# Patient Record
Sex: Female | Born: 1970 | Race: White | Hispanic: No | Marital: Married | Smoking: Former smoker
Health system: Southern US, Community
[De-identification: ages and names within clinical notes are randomized; demographics above are authoritative.]

## PROBLEM LIST (undated history)

## (undated) DIAGNOSIS — F17200 Nicotine dependence, unspecified, uncomplicated: Secondary | ICD-10-CM

## (undated) DIAGNOSIS — M797 Fibromyalgia: Secondary | ICD-10-CM

## (undated) DIAGNOSIS — M1990 Unspecified osteoarthritis, unspecified site: Secondary | ICD-10-CM

## (undated) DIAGNOSIS — M549 Dorsalgia, unspecified: Secondary | ICD-10-CM

## (undated) DIAGNOSIS — M51369 Other intervertebral disc degeneration, lumbar region without mention of lumbar back pain or lower extremity pain: Secondary | ICD-10-CM

## (undated) DIAGNOSIS — M5136 Other intervertebral disc degeneration, lumbar region: Secondary | ICD-10-CM

## (undated) DIAGNOSIS — G8929 Other chronic pain: Secondary | ICD-10-CM

## (undated) DIAGNOSIS — K432 Incisional hernia without obstruction or gangrene: Secondary | ICD-10-CM

## (undated) HISTORY — PX: APPENDECTOMY: SHX54

## (undated) HISTORY — PX: TUMOR REMOVAL: SHX12

## (undated) HISTORY — PX: HERNIA REPAIR: SHX51

## (undated) HISTORY — DX: Incisional hernia without obstruction or gangrene: K43.2

## (undated) HISTORY — PX: TUBAL LIGATION: SHX77

## (undated) HISTORY — DX: Nicotine dependence, unspecified, uncomplicated: F17.200

## (undated) HISTORY — DX: Other intervertebral disc degeneration, lumbar region: M51.36

## (undated) HISTORY — DX: Other intervertebral disc degeneration, lumbar region without mention of lumbar back pain or lower extremity pain: M51.369

---

## 1999-01-21 ENCOUNTER — Emergency Department (HOSPITAL_COMMUNITY): Admission: EM | Admit: 1999-01-21 | Discharge: 1999-01-21 | Payer: Self-pay | Admitting: Emergency Medicine

## 1999-01-24 ENCOUNTER — Emergency Department (HOSPITAL_COMMUNITY): Admission: EM | Admit: 1999-01-24 | Discharge: 1999-01-24 | Payer: Self-pay | Admitting: Internal Medicine

## 1999-03-23 ENCOUNTER — Encounter: Payer: Self-pay | Admitting: Family Medicine

## 1999-03-23 ENCOUNTER — Ambulatory Visit (HOSPITAL_COMMUNITY): Admission: RE | Admit: 1999-03-23 | Discharge: 1999-03-23 | Payer: Self-pay | Admitting: Family Medicine

## 1999-10-18 HISTORY — PX: HIATAL HERNIA REPAIR: SHX195

## 1999-11-17 ENCOUNTER — Emergency Department (HOSPITAL_COMMUNITY): Admission: EM | Admit: 1999-11-17 | Discharge: 1999-11-17 | Payer: Self-pay | Admitting: Emergency Medicine

## 2000-05-19 ENCOUNTER — Encounter: Admission: RE | Admit: 2000-05-19 | Discharge: 2000-05-19 | Payer: Self-pay | Admitting: Family Medicine

## 2000-05-22 ENCOUNTER — Emergency Department (HOSPITAL_COMMUNITY): Admission: EM | Admit: 2000-05-22 | Discharge: 2000-05-22 | Payer: Self-pay

## 2000-05-22 ENCOUNTER — Encounter: Payer: Self-pay | Admitting: General Surgery

## 2000-05-29 ENCOUNTER — Encounter: Admission: RE | Admit: 2000-05-29 | Discharge: 2000-05-29 | Payer: Self-pay | Admitting: Family Medicine

## 2000-05-29 ENCOUNTER — Encounter: Payer: Self-pay | Admitting: Family Medicine

## 2000-06-26 ENCOUNTER — Encounter: Payer: Self-pay | Admitting: *Deleted

## 2000-06-26 ENCOUNTER — Ambulatory Visit (HOSPITAL_COMMUNITY): Admission: RE | Admit: 2000-06-26 | Discharge: 2000-06-26 | Payer: Self-pay | Admitting: *Deleted

## 2000-07-03 ENCOUNTER — Ambulatory Visit (HOSPITAL_COMMUNITY): Admission: RE | Admit: 2000-07-03 | Discharge: 2000-07-03 | Payer: Self-pay | Admitting: *Deleted

## 2000-09-20 ENCOUNTER — Encounter: Payer: Self-pay | Admitting: Surgery

## 2000-09-26 ENCOUNTER — Inpatient Hospital Stay (HOSPITAL_COMMUNITY): Admission: RE | Admit: 2000-09-26 | Discharge: 2000-09-27 | Payer: Self-pay | Admitting: Surgery

## 2001-07-05 ENCOUNTER — Encounter: Payer: Self-pay | Admitting: Emergency Medicine

## 2001-07-05 ENCOUNTER — Emergency Department (HOSPITAL_COMMUNITY): Admission: EM | Admit: 2001-07-05 | Discharge: 2001-07-05 | Payer: Self-pay | Admitting: Emergency Medicine

## 2004-04-12 ENCOUNTER — Other Ambulatory Visit: Admission: RE | Admit: 2004-04-12 | Discharge: 2004-04-12 | Payer: Self-pay | Admitting: Obstetrics and Gynecology

## 2004-06-29 ENCOUNTER — Inpatient Hospital Stay (HOSPITAL_COMMUNITY): Admission: AD | Admit: 2004-06-29 | Discharge: 2004-06-29 | Payer: Self-pay | Admitting: Obstetrics and Gynecology

## 2004-07-03 ENCOUNTER — Inpatient Hospital Stay (HOSPITAL_COMMUNITY): Admission: AD | Admit: 2004-07-03 | Discharge: 2004-07-05 | Payer: Self-pay | Admitting: Obstetrics and Gynecology

## 2005-02-15 ENCOUNTER — Emergency Department (HOSPITAL_COMMUNITY): Admission: EM | Admit: 2005-02-15 | Discharge: 2005-02-15 | Payer: Self-pay | Admitting: Emergency Medicine

## 2007-06-22 ENCOUNTER — Inpatient Hospital Stay (HOSPITAL_COMMUNITY): Admission: AD | Admit: 2007-06-22 | Discharge: 2007-06-22 | Payer: Self-pay | Admitting: Obstetrics & Gynecology

## 2007-07-03 ENCOUNTER — Inpatient Hospital Stay (HOSPITAL_COMMUNITY): Admission: AD | Admit: 2007-07-03 | Discharge: 2007-07-03 | Payer: Self-pay | Admitting: Obstetrics and Gynecology

## 2008-07-19 ENCOUNTER — Inpatient Hospital Stay (HOSPITAL_COMMUNITY): Admission: AD | Admit: 2008-07-19 | Discharge: 2008-07-19 | Payer: Self-pay | Admitting: Obstetrics and Gynecology

## 2008-08-01 ENCOUNTER — Inpatient Hospital Stay (HOSPITAL_COMMUNITY): Admission: AD | Admit: 2008-08-01 | Discharge: 2008-08-02 | Payer: Self-pay | Admitting: Obstetrics and Gynecology

## 2008-08-04 ENCOUNTER — Inpatient Hospital Stay (HOSPITAL_COMMUNITY): Admission: AD | Admit: 2008-08-04 | Discharge: 2008-08-07 | Payer: Self-pay | Admitting: Obstetrics and Gynecology

## 2008-08-06 ENCOUNTER — Encounter (INDEPENDENT_AMBULATORY_CARE_PROVIDER_SITE_OTHER): Payer: Self-pay | Admitting: Obstetrics and Gynecology

## 2011-03-01 NOTE — Op Note (Signed)
Kelsey West, [employer]             ACCOUNT NO.:  [id number]   MEDICAL RECORD NO.:  [id number]          PATIENT TYPE:  INP   LOCATION:  9144                          FACILITY:  [facility]   PHYSICIAN:  Sherron Monday, MD        DATE OF BIRTH:  04-Dec-1970   DATE OF PROCEDURE:  08/06/2008  DATE OF DISCHARGE:                               OPERATIVE REPORT   PREOPERATIVE DIAGNOSIS:  Undesired fertility, status post spontaneous  vaginal delivery.   POSTOPERATIVE DIAGNOSIS:  Undesired fertility, status post spontaneous  vaginal delivery.   PROCEDURE:  Postpartum bilateral tubal ligation by Pomeroy method.   ANESTHESIA:  General with 5 mL of 0.25% Marcaine for local anesthesia.   SURGEON:  Sherron Monday, MD   FINDINGS:  Normal postpartum uterus, normal bilateral fallopian tubes.   SPECIMEN:  Bilateral tubal segments to pathology.   COMPLICATIONS:  None.   ESTIMATED BLOOD LOSS:  Minimal.   INTRAVENOUS FLUIDS:  800cc.   URINE OUTPUT:  She voided directly before the procedure.   COMPLICATIONS:  None.   PROCEDURE:  After informed consent was reviewed with the patient  including risks, benefits, and alternatives of a tubal ligation as well  as reversible forms of birth control and increased risk of ectopic with  failure of tubal ligation and future pregnancy, the patient voices  understanding of above procedure.  She was transported back to the  operating room, placed on the table in supine position,  General  anesthesia was induced and found to be adequate.  She was then prepped  and draped in the normal sterile fashion.  Approximately 2-cm  infraumbilical incision was made, carried through the underlying layer  of fascia sharply.  The fascia was grasped with Kocher clamps, elevated,  and incised with Mayo scissors.  This was extended superiorly and  inferiorly.  Peritoneum was entered bluntly.  Confirmation of entry from  digital exam.  The patient was placed in leftward tilt.  A moist  tape  laparotomy sponge was placed and the tube was easily identified,  followed out to the fimbriated end, elevated with Babcock, and the tube  was doubly ligated with plain gut suture.  The intervening portion was  removed in a Pomeroy fashion and sent to pathology.  The patient was  then placed in the rightward tilt.  The moist tape laparotomy sponge was  placed to remove the bowel from the field of vision.  The tube was  easily identified, carried out to the fimbriated end, and doubly ligated  in a Pomeroy fashion.  The intervening portion was removed and sent to  pathology.  Hemostasis was  assured.  The fascia was reapproximated using 0 Vicryl.  Skin was  reapproximated using 3-0 Vicryl in subcuticular fashion.  Benzoin and  Steris were applied.  The patient tolerated the procedure well.  Sponge,  lap, and needle counts were correct x2 at the end of the procedure per  the operating room staff.      Sherron Monday, MD  Electronically Signed     JB/MEDQ  D:  08/06/2008  T:  08/07/2008  Job:  [id number]

## 2011-03-01 NOTE — Discharge Summary (Signed)
Kelsey West, CHASTANG             ACCOUNT NO.:  [id number]   MEDICAL RECORD NO.:  [id number]          PATIENT TYPE:  INP   LOCATION:  9144                          [facility]ILITY[facility]  [facility]   PHYSICIAN:  Sherron Monday, MD        DATE OF BIRTH:  12-02-1970   DATE OF ADMISSION:  08/05/2008  DATE OF DISCHARGE:  08/07/2008                               DISCHARGE SUMMARY   ADMITTING DIAGNOSIS:  Intrauterine pregnancy at term in early labor,  undesired fertility.   DISCHARGE DIAGNOSIS:  Intrauterine pregnancy at term in early labor,  delivered,  status post postpartum bilateral tubal ligation.   PROCEDURE:  Spontaneous vaginal delivery, postpartum bilateral tubal  ligation.   HISTORY OF PRESENT ILLNESS:  This is a 40 year old G7, P2-0-4-2 at 37  plus weeks with contractions increasing in intensity and frequency.  She  states she has good fetal movement.  No loss of fluid.  No vaginal  bleeding.  Her pregnancy is complicated by advanced maternal age,  otherwise uncomplicated.  Please refer to her prenatal records for  complete information.  She was evaluated at the Maternal Admissions Unit  and found to have cervical change.  She was admitted for labor.   PAST MEDICAL HISTORY:  Significant for kidney stones.   PAST SURGICAL HISTORY:  Significant for D&C, finger surgery,  appendectomy, and hiatal hernia repair.   PAST OB/GYN HISTORY:  G1 and 2 were miscarriages, G3 was the term  vaginal delivery, G4 and G5 were therapeutic abortions.  G6 was a term  vaginal delivery.  G7 is present pregnancy.  No history of any abnormal  Pap smears or sexually transmitted disease.   MEDICATIONS:  Include prenatal vitamins.   ALLERGIES:  No known drug allergies.   SOCIAL HISTORY:  She smokes half a pack day.  No alcohol or drug use and  is married.   FAMILY HISTORY:  Significant for coronary artery disease in mother,  paternal grandfather with the stroke, brother with OCD, COPD in maternal  grandmother and  thyroid disease in mother.   PRENATAL LABS:  Hemoglobin 12.4, platelets 317, O positive, antibody  screen negative, gonorrhea negative, chlamydia negative, RPR  nonreactive, rubella immune.  Cystic fibrosis screen negative.  Hepatitis C surface antigen negative.  HIV negative.  First trimester  screen within normal limits.  AFB within normal limits.  Glucola of 140  with a 3-hour GTT of 75, 133, 99, and 90.  Group B strep was negative.   First trimester ultrasound revealed normal nuchal translucency and  second trimester U/S confirmed [facility] August 22, 2008, revealed normal  anatomy, anterior placenta, and a female infant.   On admission, she was afebrile.  Vital signs stable and a benign exam  with fetal heart rate tones in the 130s and reactive and contracting  every 2 to 4 minutes.  She was admitted and as she was group B strep  negative, did not need prophylaxis.  Plan was for artificial rupture of  membranes which was performed with clear fluid, and she progressed  rapidly to complete and plus 2, to deliver for approximately  5 minutes.  She delivered viable female infant at 2:54 a.m. with Apgars of 9 at 1  minute and 9 at 5 minute, infant weight is 6 pounds, 4 ounces.  She had  the  labial laceration repaired with 3-0 Vicryl in typical fashion and  placenta was expressed intact and EBL was less than 500 mL.  Her  postpartum course was relatively uncomplicated.  On postpartum day #1.  She underwent a bilateral tubal ligation without complications.  She was  discharged home on postop #2 having remained afebrile with stable vital  signs.  Hemoglobin decreased from 12.9 to 10.6.  She was discharged to  home with prescriptions for Motrin, Vicodin, and prenatal vitamins.  She  was given routine discharge instructions and numbers to call with any  questions or problems.  She voiced understanding of all of these and was  discharged home with followup in 2 weeks.      Sherron Monday, MD   Electronically Signed     JB/MEDQ  D:  08/07/2008  T:  08/07/2008  Job:  [id number]

## 2011-03-04 NOTE — Op Note (Signed)
[facility]  Patient:    Kelsey West, Kelsey West                    MRN: [id number] Proc. Date: 07/03/00 Adm. Date:  [id number] Disc. Date: [id number] Attending:  Sharyn Dross                           Operative Report  PREOPERATIVE DIAGNOSES: 1. Large hiatal hernia. 2. History of some dysphagia-like symptoms.  POSTOPERATIVE DIAGNOSES:  Slow peristalsis but grossly normal esophageal manometry study.  PROCEDURE:  Esophageal manometry study.  MEDICATIONS:  None.  ENDOSCOPIST:  Sharyn Dross., M.D.  PREOPERATIVE PREPARATION:  The patient was brought to the endoscopy unit where she was placed in a chair at this time.  The esophageal manometry probe, which is a probe which has three measuring devices on it, was then passed through the nasal cavity into the esophageal area.  The purpose of this was to measure the pressure to determine if there was evidence of poor esophageal peristaltic activity was noted.  The has the largest of hiatal hernias.  Based on the x-ray findings at that point, she was evaluated for possibility of laparoscopic fundoplication and, in the process, esophageal manometry studies were performed.  DESCRIPTION OF PROCEDURE:  With the instrument advanced into the nasal cavity at this time, the instrument was advanced all the way into the esophageal region.  The instrument was gradually retracted back where pressures were measured at the lowest esophageal sphincter as well as the esophageal body that was noted.  The patient tolerated the procedure well after it was completed and instrument was subsequently removed.  RESULTS:  The patient was noted to have low esophageal pressures that were noted with good relaxation that was appreciated.  The relaxation pressure went from approximately from 15 to 20 mm and relaxed all the way down to just above 0 that was present.  On two waveform propagations that was noted, this was repeated,  and the results were noted as above.  Relaxation time appeared to be unremarkable, and the contractions occurred back to approximately  25 mmHg that was noted.  The procedure was repeated again for duplication, and the duplication was noted to be performed.  The esophageal measurements and testing were consistent with good peristaltic activity that was noted.  There was wave propagation throughout the area, and there was not evidence of any spontaneous propagation that was noted.  The waveforms varied between the leads, but the range ranged between 20 and 125 mmHg pressure that was present.  The propagation appeared to be normal throughout the area.  The upper esophageal sphincter and the pharyngeal areas shows evidence of excellent wave propagation that was noted with good peristaltic activity appreciated.  This result also appeared to be normal.  RECOMMENDATIONS:  The patient has grossly normal peristaltic activity at this time.  Due to the fact that she has such a large hiatal hernia that is present, we will presently refer her for surgical evaluation for possible laparoscopic fundoplication or direct surgery to correct this problem. DD:  07/19/00 TD:  07/20/00 Job: [id number]

## 2011-03-04 NOTE — Op Note (Signed)
[facility]  Patient:    TEGAN, BRITAIN             MRN: [id number] Proc. Date: 09/26/00 Adm. Date:  [id number] Attending:  Katha Cabal CC:         Sharyn Dross., M.D.   Operative Report  PREOPERATIVE DIAGNOSIS:  Sliding hiatal hernia with gastroesophageal reflux disease.  POSTOPERATIVE DIAGNOSIS:  Sliding hiatal hernia with gastroesophageal reflux disease.  PROCEDURE:  Laparoscopic Nissen fundoplication (3) with closure of the crura, four, one anterior and three posterior with two figure-of-eights.  SURGEON:  Thornton Park. Daphine Deutscher, M.D.  ASSISTANT:  Sharlet Salina T. Hoxworth, M.D.  ANESTHESIA:  General endotracheal.  DESCRIPTION OF PROCEDURE:  Ms. Glendora Clouatre is a 40 year old lady with about a seven year history of gastroesophageal reflux disease.  The hiatal hernia, which was fairly large, was easily exposed, and this was done after I placed the purse-string above the umbilicus and inserted a camera through Hasson technique and then four upper abdominal trocar placements.  The patient was in a supine position.  The lesser curvature was grasped and using the harmonic scalpel, the hepatogastric ligament was taken down, and I incised up over the right crura to completely dissect the right crura.  I then carried this anteriorly and went across and dissected the left crura.  I went posteriorly and identified the vagal nerve which I kept up next to the esophagus in the subsequent wrap.  Next, we went over and entered the lesser sac and took down all the short gastrics using harmonic scalpel and carried this up, again to the left crura and dissected that.  With a Penrose drain around the EG junction, I then exposed the hiatus and closed this with two figure-of-eight sutures and a simple suture posteriorly. Anteriorly, a single simple suture was used to approximate that.  This snugged up the esophagus nicely.  A 56 dilator was  then passed easily, and the cardia was brought around behind the esophagus.  A contiguous portion of the stomach along the greater curvature side was taken and was sutured to the wrapped portion of the stomach using the endostitch.  Three sutures were placed, each with a purchase of esophagus, and the top was tied extracorporealy with two intracorporeal ties.  Then we tacked the wrapped portion of the stomach to the right crus and the left portion of the stomach to the left crus.  These were tied down as well.  There was essentially no bleeding noted.  The wrap appeared to be in good position and with the Bougie removed, it looked adequately snug and nicely closed.  The patient seemed to tolerate the procedure well.  The port sites were injected with Marcaine.  The umbilical port was tied down, and a single simple extra suture of 0 Vicryl was used to close the fascia at the supraumbilical port.  All of the other trocars were withdrawn, and the abdomen was deflated.  The wounds were then closed with 4-0 Vicryl with Benzoin and Steri-Strips.  The patient was taken to the recovery room in satisfactory condition. DD:  09/26/00 TD:  09/26/00 Job: [id number]

## 2011-03-04 NOTE — Discharge Summary (Signed)
Kelsey West, Kelsey West             ACCOUNT NO.:  [id number]   MEDICAL RECORD NO.:  [id number]          PATIENT TYPE:  INP   LOCATION:  9101                          [facility]ILITY[facility]  [facility]   PHYSICIAN:  Zenaida Niece, M.D.DATE OF BIRTH:  1970-10-18   DATE OF ADMISSION:  07/03/2004  DATE OF DISCHARGE:  07/05/2004                                 DISCHARGE SUMMARY   ADMISSION DIAGNOSIS:  Intrauterine pregnancy at 37 weeks.   DISCHARGE DIAGNOSIS:  Intrauterine pregnancy at 37 weeks.   PROCEDURE PERFORMED:  On July 03, 2004 she had a spontaneous vaginal  delivery.   HISTORY AND PHYSICAL:  This is a 40 year old white female, gravida 6, para 1-  0-4-1 with an EGA of 37+ weeks who presents with the complaint of regular  contractions and bloody show with good fetal movement and no rupture of  membranes.  Evaluation at triage revealed her to have regular contractions  and the cervix changed from 3 to 4 cm dilated.   PRENATAL CARE:  Complicated by late prenatal care initiated at 26 weeks,  gastroesophageal reflux disease treated with over-the-counter Zantac.   PRENATAL LABORATORY DATA:  Blood type is O positive with a  negative  antibody screen.  RPR nonreactive.  Rubella immune.  Hepatitis B surface  antigen negative.  HIV negative.  Gonorrhea and Chlamydia negative.  One  hour Glucola was 161.  Three hours GTT was 85, 165, 136, and 125.  Group B  Streptococcus is negative.   PAST OBSTETRICAL HISTORY:  In 1994, a vaginal delivery at 38 weeks, 8 pounds  3 ounces, no complications.  She has had two spontaneous abortions and two  elective terminations.   PAST MEDICAL HISTORY:  Hiatal hernia.   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   MEDICATIONS PRIOR TO ADMISSION:  Over-the-counter Zantac.   SOCIAL HISTORY:  She does smoke a half-pack to a pack of cigarettes a day.   PHYSICAL EXAMINATION:  VITAL SIGNS:  She was afebrile with  stable vital  signs.  Fetal heart tracing reactive with contractions every 2-4 minutes.  ABDOMEN:  Gravid, nontender with an estimated fetal weight of 7-1/2 pounds.  The cervix was 6, complete, 0, with a vertex presentation, and amniotomy  reveals clear fluid.   HOSPITAL COURSE:  The patient was admitted and progressed from 4 to 6 cm.  Amniotomy was performed for augmentation.  She progressed to complete,  pushed well, and on the evening of July 03, 2004 had a vaginal delivery  of a viable female infant with Apgar's of 9 and 9 who weighed 6 pounds 12  ounces.  Placenta delivered spontaneously and was intact.  The perineum had  2-3 small abrasions which were hemostatic and not repaired.  Estimated blood  loss was less than 500 cubic centimeters.  Postpartum, the patient had no  significant complications.  On the evening of delivery, she did fall in the  bathroom and hit her head but had no loss of consciousness and no further  complications.  Predelivery CBC revealed a white count of 19.2,  hemoglobin  11.7, platelet count of 405,000.  Postpartum CBC revealed a white count of  21.8, hemoglobin 8.4, platelet count of 366,000 and that is on postpartum  day #1.  CBC on postpartum day #2 revealed a white count decreasing to 17.9,  hemoglobin 7.5, platelet count of 384,000.  She had no obvious evidence of  infection at that point, and was felt to be stable enough for discharge  home.   DISCHARGE INSTRUCTIONS:  Regular.   DISCHARGE ACTIVITIES:  Pelvic rest.   FOLLOW UP:  In six weeks.   DISCHARGE MEDICATIONS:  Percocet #20, 1-2 p.o. q.4-6h. p.r.n. pain and over-  the-counter ibuprofen as needed.  She is also to take over-the-counter iron  b.i.d.   She is given our discharge pamphlet.     Todd   TDM/MEDQ  D:  07/18/2004  T:  07/19/2004  Job:  [id number]

## 2011-07-18 LAB — CBC
MCHC: 33.5
MCV: 95.2
Platelets: 290
Platelets: 316
RDW: 13

## 2011-07-18 LAB — RPR: RPR Ser Ql: NONREACTIVE

## 2011-07-29 LAB — URINALYSIS, ROUTINE W REFLEX MICROSCOPIC
Ketones, ur: NEGATIVE
Protein, ur: NEGATIVE
Urobilinogen, UA: 0.2

## 2011-07-29 LAB — CBC
MCV: 91
RBC: 4.76
WBC: 17.4 — ABNORMAL HIGH

## 2011-07-29 LAB — GC/CHLAMYDIA PROBE AMP, GENITAL: GC Probe Amp, Genital: NEGATIVE

## 2011-07-29 LAB — WET PREP, GENITAL
Trich, Wet Prep: NONE SEEN
Yeast Wet Prep HPF POC: NONE SEEN

## 2011-07-29 LAB — URINE MICROSCOPIC-ADD ON

## 2011-07-29 LAB — POCT PREGNANCY, URINE: Operator id: 220991

## 2011-07-29 LAB — HCG, QUANTITATIVE, PREGNANCY: hCG, Beta Chain, Quant, S: 5736 — ABNORMAL HIGH

## 2011-07-29 LAB — ABO/RH: ABO/RH(D): O POS

## 2012-02-29 ENCOUNTER — Ambulatory Visit (HOSPITAL_COMMUNITY)
Admission: RE | Admit: 2012-02-29 | Discharge: 2012-02-29 | Disposition: A | Discharge status: Home / Self Care | Payer: Self-pay | Source: Ambulatory Visit | Attending: Internal Medicine | Admitting: Internal Medicine

## 2012-02-29 ENCOUNTER — Other Ambulatory Visit (HOSPITAL_COMMUNITY): Payer: Self-pay | Admitting: Internal Medicine

## 2012-02-29 DIAGNOSIS — M5380 Other specified dorsopathies, site unspecified: Secondary | ICD-10-CM | POA: Insufficient documentation

## 2012-02-29 DIAGNOSIS — M545 Low back pain, unspecified: Secondary | ICD-10-CM | POA: Insufficient documentation

## 2012-02-29 DIAGNOSIS — M542 Cervicalgia: Secondary | ICD-10-CM | POA: Insufficient documentation

## 2012-02-29 DIAGNOSIS — Q765 Cervical rib: Secondary | ICD-10-CM | POA: Insufficient documentation

## 2012-02-29 DIAGNOSIS — M79609 Pain in unspecified limb: Secondary | ICD-10-CM | POA: Insufficient documentation

## 2013-03-05 ENCOUNTER — Emergency Department (HOSPITAL_COMMUNITY)
Admission: EM | Admit: 2013-03-05 | Discharge: 2013-03-05 | Disposition: A | Discharge status: Home / Self Care | Payer: Self-pay | Attending: Emergency Medicine | Admitting: Emergency Medicine

## 2013-03-05 ENCOUNTER — Encounter (HOSPITAL_COMMUNITY): Payer: Self-pay | Admitting: *Deleted

## 2013-03-05 DIAGNOSIS — M7989 Other specified soft tissue disorders: Secondary | ICD-10-CM | POA: Insufficient documentation

## 2013-03-05 DIAGNOSIS — G8929 Other chronic pain: Secondary | ICD-10-CM | POA: Insufficient documentation

## 2013-03-05 DIAGNOSIS — M549 Dorsalgia, unspecified: Secondary | ICD-10-CM | POA: Insufficient documentation

## 2013-03-05 DIAGNOSIS — Z760 Encounter for issue of repeat prescription: Secondary | ICD-10-CM | POA: Insufficient documentation

## 2013-03-05 DIAGNOSIS — R209 Unspecified disturbances of skin sensation: Secondary | ICD-10-CM | POA: Insufficient documentation

## 2013-03-05 DIAGNOSIS — Z79899 Other long term (current) drug therapy: Secondary | ICD-10-CM | POA: Insufficient documentation

## 2013-03-05 DIAGNOSIS — Z8739 Personal history of other diseases of the musculoskeletal system and connective tissue: Secondary | ICD-10-CM | POA: Insufficient documentation

## 2013-03-05 HISTORY — DX: Dorsalgia, unspecified: M54.9

## 2013-03-05 HISTORY — DX: Other chronic pain: G89.29

## 2013-03-05 MED ORDER — HYDROCODONE-ACETAMINOPHEN 5-325 MG PO TABS: 1.0000 | ORAL_TABLET | Freq: Four times a day (QID) | ORAL | Status: DC | PRN

## 2013-03-05 MED ORDER — NAPROXEN 500 MG PO TABS: 500.0000 mg | ORAL_TABLET | Freq: Two times a day (BID) | ORAL | Status: DC | PRN

## 2013-03-05 MED ORDER — METHOCARBAMOL 750 MG PO TABS: 750.0000 mg | ORAL_TABLET | Freq: Four times a day (QID) | ORAL | Status: DC | PRN

## 2013-03-05 NOTE — ED Notes (Signed)
Pt in c/o chronic back and neck pain, states pain has been going on since last year, her doctor recently left the practice and she is out of medication she normally takes for pain, states it radiates into her arms and her legs.

## 2013-03-05 NOTE — ED Provider Notes (Signed)
History  This chart was scribed for non-physician practitioner Dierdre Forth, PA-C working with Dione Booze, MD, by Candelaria Stagers, ED Scribe. This patient was seen in room TR09C/TR09C and the patient's care was started at 4:02 PM   CSN: [id number]  Arrival date & time 03/05/13  1538   First MD Initiated Contact with Patient 03/05/13 1552      Chief Complaint  Patient presents with  . Back Pain   The history is provided by the patient. No language interpreter was used.   HPI Comments: Kelsey West is a 42 y.o. female who presents to the Emergency Department complaining of gradually worsening chronic back pain which radiates to her lower back, upper back, neck, and bilateral shoulders that started about one year ago.  Pt reports she has been diagnosed with degenerative disc disease.  She has been seen over the last year at [facility] who has told her they can no longer manage her pain at this clinic.  Pt typically takes Lortab which she reports is no longer helping with the pain.  She reports new sx of mottling to the skin of legs and arms that started several weeks ago occurring more when she is cold with no associated symptoms.  Pt also reports swelling of knuckles, more to right hand than left, which is worse after excessive usage of hands.  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  She also reports some numbness to the right foot while driving that she describes as the foot feeling asleep, but does not experience this at other times.  Pt denies loss of bowel or bladder control, saddle anesthesia, foot drop, loss of leg function.  Pt has taken ibuprofen and applied ice and heat with little relief.    Past Medical History  Diagnosis Date  . Chronic back pain     History reviewed. No pertinent past surgical history.  History reviewed. No pertinent family history.  History  Substance Use Topics  . Smoking status: Not on file  . Smokeless tobacco:  Not on file  . Alcohol Use: Not on file    OB History   Grav Para Term Preterm Abortions TAB SAB Ect Mult Living                  Review of Systems  Musculoskeletal: Positive for back pain and joint swelling (intermittent swelling of bilateral knuckles).  Skin: Positive for color change (intermittent mottling of skin).  Neurological: Positive for numbness (occasional swelling of right foot).  All other systems reviewed and are negative.    Allergies  Review of patient's allergies indicates no known allergies.  Home Medications   Current Outpatient Rx  Name  Route  Sig  Dispense  Refill  . Aspirin-Salicylamide-Caffeine (BC HEADACHE POWDER PO)   Oral   Take 1 packet by mouth daily as needed (for pain).         . cyclobenzaprine (FLEXERIL) 10 MG tablet   Oral   Take 10 mg by mouth at bedtime as needed for muscle spasms.         Marland Kitchen HYDROcodone-acetaminophen (NORCO) 10-325 MG per tablet   Oral   Take 1 tablet by mouth every 6 (six) hours as needed for pain.         Marland Kitchen ibuprofen (ADVIL,MOTRIN) 200 MG tablet   Oral   Take 800 mg by mouth every 6 (six) hours as needed for pain.         Marland Kitchen  HYDROcodone-acetaminophen (NORCO/VICODIN) 5-325 MG per tablet   Oral   Take 1 tablet by mouth every 6 (six) hours as needed for pain (Take 1 - 2 tablets every 4 - 6 hours.).   20 tablet   0   . methocarbamol (ROBAXIN) 750 MG tablet   Oral   Take 1 tablet (750 mg total) by mouth 4 (four) times daily as needed (Take 1 tablet every 6 hours as needed for muscle spasms.).   20 tablet   0   . naproxen (NAPROSYN) 500 MG tablet   Oral   Take 1 tablet (500 mg total) by mouth 2 (two) times daily as needed.   30 tablet   0     BP 155/94  Pulse 105  Temp(Src) 98.3 F (36.8 C) (Oral)  Resp 20  Ht 5\' 6"  (1.676 m)  Wt 150 lb (68.04 kg)  BMI 24.22 kg/m2  SpO2 99%  Physical Exam  Nursing note and vitals reviewed. Constitutional: She is oriented to person, place, and time. She  appears well-developed and well-nourished. No distress.  HENT:  Head: Normocephalic and atraumatic.  Mouth/Throat: Oropharynx is clear and moist. No oropharyngeal exudate.  Eyes: Conjunctivae are normal.  Neck: Normal range of motion. Neck supple.  Full ROM without pain  Cardiovascular: Normal rate, regular rhythm and intact distal pulses.   Pulmonary/Chest: Effort normal and breath sounds normal. No respiratory distress. She has no wheezes.  Abdominal: Soft. She exhibits no distension. There is no tenderness.  Musculoskeletal:  Para spinal tenderness of low C-spine to L-Spine.    Lymphadenopathy:    She has no cervical adenopathy.  Neurological: She is alert and oriented to person, place, and time. She has normal reflexes.  Speech is clear and goal oriented, follows commands Normal strength in upper and lower extremities bilaterally including dorsiflexion and plantar flexion, strong and equal grip strength Sensation normal to light and sharp touch Moves extremities without ataxia, coordination intact Normal gait Normal balance   Skin: Skin is warm and dry. No rash noted. She is not diaphoretic. No erythema.  Psychiatric: She has a normal mood and affect. Her behavior is normal.    ED Course  Procedures  DIAGNOSTIC STUDIES: Oxygen Saturation is 99% on room air, normal by my interpretation.    COORDINATION OF CARE:  4:10 PM Discussed course of care with pt which includes antiinflammatory and pain medication.  Return precautions discussed.  Will provide resources for follow up.  Pt understands and agrees.   Labs Reviewed - No data to display No results found.   1. Chronic back pain greater than 3 months duration   2. Medication refill       MDM  Linna Hoff presents with chronic back pain and without further follow-up for pain management.  Pt took her last medication this morning.  Patient with back pain on exam.  No neurological deficits and normal neuro exam.   Patient can walk but states is painful.  No loss of bowel or bladder control.  No concern for cauda equina.  No fever, night sweats, weight loss, h/o cancer, IVDU.  RICE protocol and pain medicine indicated and discussed with patient. Will refill medication for several days and give referral to pain management and resource guide to find PCP.  I have also discussed reasons to return immediately to the ER.  Patient expresses understanding and agrees with plan.  I personally performed the services described in this documentation, which was scribed in my presence. The  recorded information has been reviewed and is accurate.         Dahlia Client Ziggy Chanthavong, PA-C 03/05/13 1628

## 2013-03-06 NOTE — ED Provider Notes (Signed)
Medical screening examination/treatment/procedure(s) were performed by non-physician practitioner and as supervising physician I was immediately available for consultation/collaboration.   Dione Booze, MD 03/06/13 Moses Manners

## 2013-03-21 ENCOUNTER — Emergency Department (HOSPITAL_COMMUNITY)
Admission: EM | Admit: 2013-03-21 | Discharge: 2013-03-21 | Disposition: A | Discharge status: Home / Self Care | Payer: Self-pay | Attending: Emergency Medicine | Admitting: Emergency Medicine

## 2013-03-21 ENCOUNTER — Encounter (HOSPITAL_COMMUNITY): Payer: Self-pay | Admitting: Emergency Medicine

## 2013-03-21 DIAGNOSIS — Z8739 Personal history of other diseases of the musculoskeletal system and connective tissue: Secondary | ICD-10-CM | POA: Insufficient documentation

## 2013-03-21 DIAGNOSIS — M542 Cervicalgia: Secondary | ICD-10-CM | POA: Insufficient documentation

## 2013-03-21 DIAGNOSIS — G8929 Other chronic pain: Secondary | ICD-10-CM | POA: Insufficient documentation

## 2013-03-21 DIAGNOSIS — M549 Dorsalgia, unspecified: Secondary | ICD-10-CM | POA: Insufficient documentation

## 2013-03-21 DIAGNOSIS — R339 Retention of urine, unspecified: Secondary | ICD-10-CM | POA: Insufficient documentation

## 2013-03-21 MED ORDER — IBUPROFEN 800 MG PO TABS
800.0000 mg | ORAL_TABLET | Freq: Once | ORAL | Status: AC
  Administered 2013-03-21: 800 mg via ORAL
  Filled 2013-03-21: qty 1

## 2013-03-21 NOTE — ED Notes (Signed)
Pain sharp in neck and back x 3-4 days rads to hips

## 2013-03-21 NOTE — ED Provider Notes (Signed)
History     CSN: [id number]  Arrival date & time 03/21/13  1120   First MD Initiated Contact with Patient 03/21/13 1125      Chief Complaint  Patient presents with  . Back Pain    (Consider location/radiation/quality/duration/timing/severity/associated sxs/prior treatment) HPI  42 year old female presents complaining of back pain. Patient reports she has a history of degenerative disc disease which has been giving her neck and back pain ongoing for over a year. Pain is described as a throbbing aching sensation worsening with prolonged standing and with movement and sometimes improves with rest. She also report occasional tingling sensation to the ball of her right feet. She reports pain radiates up and down the back and has been worsened for the past for 5 days. Pain is not relieved with taking ibuprofen at home. She reports she has had treatment from her primary care Dr. with Vicodin and muscle relaxer for over a year however he is no longer working and she cannot get the medication anymore. Patient denies fever, chills, rash, urinary or bowel incontinence, or saddle paresthesia. No recent trauma. Denies any dysuria or hematuria. Normal bowel movement.  Past Medical History  Diagnosis Date  . Chronic back pain     No past surgical history on file.  No family history on file.  History  Substance Use Topics  . Smoking status: Not on file  . Smokeless tobacco: Not on file  . Alcohol Use: Not on file    OB History   Grav Para Term Preterm Abortions TAB SAB Ect Mult Living                  Review of Systems  Constitutional: Negative for fever.  HENT: Positive for neck pain.   Musculoskeletal: Positive for back pain.  Skin: Negative for rash.  Neurological: Negative for numbness and headaches.    Allergies  Review of patient's allergies indicates no known allergies.  Home Medications   Current Outpatient Rx  Name  Route  Sig  Dispense  Refill  .  Aspirin-Salicylamide-Caffeine (BC HEADACHE POWDER PO)   Oral   Take 1 packet by mouth daily as needed (for pain).         . cyclobenzaprine (FLEXERIL) 10 MG tablet   Oral   Take 10 mg by mouth at bedtime as needed for muscle spasms.         Marland Kitchen HYDROcodone-acetaminophen (NORCO) 10-325 MG per tablet   Oral   Take 1 tablet by mouth every 6 (six) hours as needed for pain.         Marland Kitchen HYDROcodone-acetaminophen (NORCO/VICODIN) 5-325 MG per tablet   Oral   Take 1 tablet by mouth every 6 (six) hours as needed for pain (Take 1 - 2 tablets every 4 - 6 hours.).   20 tablet   0   . ibuprofen (ADVIL,MOTRIN) 200 MG tablet   Oral   Take 800 mg by mouth every 6 (six) hours as needed for pain.         . methocarbamol (ROBAXIN) 750 MG tablet   Oral   Take 1 tablet (750 mg total) by mouth 4 (four) times daily as needed (Take 1 tablet every 6 hours as needed for muscle spasms.).   20 tablet   0   . naproxen (NAPROSYN) 500 MG tablet   Oral   Take 1 tablet (500 mg total) by mouth 2 (two) times daily as needed.   30 tablet   0  There were no vitals taken for this visit.  Physical Exam  Nursing note and vitals reviewed. Constitutional: She is oriented to person, place, and time. She appears well-developed and well-nourished. No distress.  HENT:  Head: Atraumatic.  Eyes: Conjunctivae are normal.  Neck:  No nuchal rigidity  Abdominal: There is no tenderness.  Genitourinary:  No CVA tenderness  Musculoskeletal: She exhibits tenderness (Tenderness to paracervical and paralumbar region without significant midline spine tenderness, crepitus, step-off noted. No overlying skin changes.). She exhibits no edema.  Neurological: She is alert and oriented to person, place, and time.  Patellar deep tendon reflex 2+ bilaterally. No foot drop. Walk with a mildly antalgic gait.  Skin: No rash noted.  Psychiatric: She has a normal mood and affect.    ED Course  Procedures (including critical  care time)  11:45 AM Acute on chronic back pain, no red flags, no recent trauma. I discussed with patient that narcotic pain medication is not appropriate to treat degenerative disc disease. I reviewed her prior x-ray from 2013 and did not see any significant degenerative changes. Advance imaging not indicated today.  I recommend for patient to followup with orthopedic Dr. for further management of her condition. Rice therapy discussed. Ibuprofen given in the ED. Patient voiced understanding and agrees with plan.  Labs Reviewed - No data to display No results found.   1. Chronic back pain       MDM  BP 155/90  Pulse 91  Temp(Src) 98.3 F (36.8 C)  Resp 16  SpO2 98%         Fayrene Helper, PA-C 03/21/13 1148

## 2013-03-21 NOTE — ED Notes (Signed)
Back pain for months and has seen a dr but her feet feel occ like they are going numb . Has bee on lortab is out no new injury

## 2013-03-22 NOTE — ED Provider Notes (Signed)
Medical screening examination/treatment/procedure(s) were performed by non-physician practitioner and as supervising physician I was immediately available for consultation/collaboration.   Gwyneth Sprout, MD 03/22/13 [phone]

## 2013-05-16 ENCOUNTER — Ambulatory Visit: Payer: No Typology Code available for payment source | Attending: Family Medicine | Admitting: Family Medicine

## 2013-05-16 ENCOUNTER — Encounter: Payer: Self-pay | Admitting: Family Medicine

## 2013-05-16 VITALS — BP 138/85 | HR 78 | Temp 98.0°F | Ht 66.0 in | Wt 160.6 lb

## 2013-05-16 DIAGNOSIS — M5430 Sciatica, unspecified side: Secondary | ICD-10-CM

## 2013-05-16 DIAGNOSIS — M5431 Sciatica, right side: Secondary | ICD-10-CM

## 2013-05-16 DIAGNOSIS — G8929 Other chronic pain: Secondary | ICD-10-CM

## 2013-05-16 DIAGNOSIS — M549 Dorsalgia, unspecified: Secondary | ICD-10-CM

## 2013-05-16 NOTE — Patient Instructions (Addendum)
Back Pain, Adult Low back pain is very common. About 1 in 5 people have back pain.The cause of low back pain is rarely dangerous. The pain often gets better over time.About half of people with a sudden onset of back pain feel better in just 2 weeks. About 8 in 10 people feel better by 6 weeks.  CAUSES Some common causes of back pain include:  Strain of the muscles or ligaments supporting the spine.  Wear and tear (degeneration) of the spinal discs.  Arthritis.  Direct injury to the back. DIAGNOSIS Most of the time, the direct cause of low back pain is not known.However, back pain can be treated effectively even when the exact cause of the pain is unknown.Answering your caregiver's questions about your overall health and symptoms is one of the most accurate ways to make sure the cause of your pain is not dangerous. If your caregiver needs more information, he or she may order lab work or imaging tests (X-rays or MRIs).However, even if imaging tests show changes in your back, this usually does not require surgery. HOME CARE INSTRUCTIONS For many people, back pain returns.Since low back pain is rarely dangerous, it is often a condition that people can learn to manageon their own.   Remain active. It is stressful on the back to sit or stand in one place. Do not sit, drive, or stand in one place for more than 30 minutes at a time. Take short walks on level surfaces as soon as pain allows.Try to increase the length of time you walk each day.  Do not stay in bed.Resting more than 1 or 2 days can delay your recovery.  Do not avoid exercise or work.Your body is made to move.It is not dangerous to be active, even though your back may hurt.Your back will likely heal faster if you return to being active before your pain is gone.  Pay attention to your body when you bend and lift. Many people have less discomfortwhen lifting if they bend their knees, keep the load close to their bodies,and  avoid twisting. Often, the most comfortable positions are those that put less stress on your recovering back.  Find a comfortable position to sleep. Use a firm mattress and lie on your side with your knees slightly bent. If you lie on your back, put a pillow under your knees.  Only take over-the-counter or prescription medicines as directed by your caregiver. Over-the-counter medicines to reduce pain and inflammation are often the most helpful.Your caregiver may prescribe muscle relaxant drugs.These medicines help dull your pain so you can more quickly return to your normal activities and healthy exercise.  Put ice on the injured area.  Put ice in a plastic bag.  Place a towel between your skin and the bag.  Leave the ice on for 15-20 minutes, 3-4 times a day for the first 2 to 3 days. After that, ice and heat may be alternated to reduce pain and spasms.  Ask your caregiver about trying back exercises and gentle massage. This may be of some benefit.  Avoid feeling anxious or stressed.Stress increases muscle tension and can worsen back pain.It is important to recognize when you are anxious or stressed and learn ways to manage it.Exercise is a great option. SEEK MEDICAL CARE IF:  You have pain that is not relieved with rest or medicine.  You have pain that does not improve in 1 week.  You have new symptoms.  You are generally not feeling well. SEEK   IMMEDIATE MEDICAL CARE IF:   You have pain that radiates from your back into your legs.  You develop new bowel or bladder control problems.  You have unusual weakness or numbness in your arms or legs.  You develop nausea or vomiting.  You develop abdominal pain.  You feel faint. Document Released: 10/03/2005 Document Revised: 04/03/2012 Document Reviewed: 02/21/2011 [facility] Patient Information 2014 [place], [place]. Back Injury Prevention Back injuries can be extremely painful and difficult to heal. After having one back  injury, you are much more likely to experience another later on. It is important to learn how to avoid injuring or re-injuring your back. The following tips can help you to prevent a back injury. PHYSICAL FITNESS  Exercise regularly and try to develop good tone in your abdominal muscles. Your abdominal muscles provide a lot of the support needed by your back.  Do aerobic exercises (walking, jogging, biking, swimming) regularly.  Do exercises that increase balance and strength (tai chi, yoga) regularly. This can decrease your risk of falling and injuring your back.  Stretch before and after exercising.  Maintain a healthy weight. The more you weigh, the more stress is placed on your back. For every pound of weight, 10 times that amount of pressure is placed on the back. DIET  Talk to your caregiver about how much calcium and vitamin D you need per day. These nutrients help to prevent weakening of the bones (osteoporosis). Osteoporosis can cause broken (fractured) bones that lead to back pain.  Include good sources of calcium in your diet, such as dairy products, green, leafy vegetables, and products with calcium added (fortified).  Include good sources of vitamin D in your diet, such as milk and foods that are fortified with vitamin D.  Consider taking a nutritional supplement or a multivitamin if needed.  Stop smoking if you smoke. POSTURE  Sit and stand up straight. Avoid leaning forward when you sit or hunching over when you stand.  Choose chairs with good low back (lumbar) support.  If you work at a desk, sit close to your work so you do not need to lean over. Keep your chin tucked in. Keep your neck drawn back and elbows bent at a right angle. Your arms should look like the letter "L."  Sit high and close to the steering wheel when you drive. Add a lumbar support to your car seat if needed.  Avoid sitting or standing in one position for too long. Take breaks to get up, stretch,  and walk around at least once every hour. Take breaks if you are driving for long periods of time.  Sleep on your side with your knees slightly bent, or sleep on your back with a pillow under your knees. Do not sleep on your stomach. LIFTING, TWISTING, AND REACHING  Avoid heavy lifting, especially repetitive lifting. If you must do heavy lifting:  Stretch before lifting.  Work slowly.  Rest between lifts.  Use carts and dollies to move objects when possible.  Make several small trips instead of carrying 1 heavy load.  Ask for help when you need it.  Ask for help when moving big, awkward objects.  Follow these steps when lifting:  Stand with your feet shoulder-width apart.  Get as close to the object as you can. Do not try to pick up heavy objects that are far from your body.  Use handles or lifting straps if they are available.  Bend at your knees. Squat down, but keep your  heels off the floor.  Keep your shoulders pulled back, your chin tucked in, and your back straight.  Lift the object slowly, tightening the muscles in your legs, abdomen, and buttocks. Keep the object as close to the center of your body as possible.  When you put a load down, use these same guidelines in reverse.  Do not:  Lift the object above your waist.  Twist at the waist while lifting or carrying a load. Move your feet if you need to turn, not your waist.  Bend over without bending at your knees.  Avoid reaching over your head, across a table, or for an object on a high surface. OTHER TIPS  Avoid wet floors and keep sidewalks clear of ice to prevent falls.  Do not sleep on a mattress that is too soft or too hard.  Keep items that are used frequently within easy reach.  Put heavier objects on shelves at waist level and lighter objects on lower or higher shelves.  Find ways to decrease your stress, such as exercise, massage, or relaxation techniques. Stress can build up in your muscles.  Tense muscles are more vulnerable to injury.  Seek treatment for depression or anxiety if needed. These conditions can increase your risk of developing back pain. SEEK MEDICAL CARE IF:  You injure your back.  You have questions about diet, exercise, or other ways to prevent back injuries. MAKE SURE YOU:  Understand these instructions.  Will watch your condition.  Will get help right away if you are not doing well or get worse. Document Released: 11/10/2004 Document Revised: 12/26/2011 Document Reviewed: 11/14/2011 [facility] Patient Information 2014 [place], [place]. Back Exercises Back exercises help treat and prevent back injuries. The goal of back exercises is to increase the strength of your abdominal and back muscles and the flexibility of your back. These exercises should be started when you no longer have back pain. Back exercises include:  Pelvic Tilt. Lie on your back with your knees bent. Tilt your pelvis until the lower part of your back is against the floor. Hold this position 5 to 10 sec and repeat 5 to 10 times.  Knee to Chest. Pull first 1 knee up against your chest and hold for 20 to 30 seconds, repeat this with the other knee, and then both knees. This may be done with the other leg straight or bent, whichever feels better.  Sit-Ups or Curl-Ups. Bend your knees 90 degrees. Start with tilting your pelvis, and do a partial, slow sit-up, lifting your trunk only 30 to 45 degrees off the floor. Take at least 2 to 3 seconds for each sit-up. Do not do sit-ups with your knees out straight. If partial sit-ups are difficult, simply do the above but with only tightening your abdominal muscles and holding it as directed.  Hip-Lift. Lie on your back with your knees flexed 90 degrees. Push down with your feet and shoulders as you raise your hips a couple inches off the floor; hold for 10 seconds, repeat 5 to 10 times.  Back arches. Lie on your stomach, propping yourself up on bent elbows.  Slowly press on your hands, causing an arch in your low back. Repeat 3 to 5 times. Any initial stiffness and discomfort should lessen with repetition over time.  Shoulder-Lifts. Lie face down with arms beside your body. Keep hips and torso pressed to floor as you slowly lift your head and shoulders off the floor. Do not overdo your exercises, especially in the  beginning. Exercises may cause you some mild back discomfort which lasts for a few minutes; however, if the pain is more severe, or lasts for more than 15 minutes, do not continue exercises until you see your caregiver. Improvement with exercise therapy for back problems is slow.  See your caregivers for assistance with developing a proper back exercise program. Document Released: 11/10/2004 Document Revised: 12/26/2011 Document Reviewed: 08/04/2011 [facility] Patient Information 2014 [place], [place].

## 2013-05-16 NOTE — Progress Notes (Signed)
Patient ID: Kelsey West, female   DOB: [birth year], 42 y.o.   MRN: [id number]  He is family and an unwillingness to allow him abdomen he the wording of the limited his Ms. [name] I will see her again in 2 weeks blood the it is tomorrow   I will see her again in 2 weeks.

## 2013-05-16 NOTE — Progress Notes (Signed)
Patient ID: Kelsey West, female   DOB: 17-Oct-1971, 42 y.o.   MRN: [id number]  CC: Establish Care  HPI: Patient is a 42 yo white female that presents after ED visit for chronic back pain. Patient last Xrays in 2013 show mild degenerative changes in the lumbar and cervical spine. Patient can be referred to get more recent Xray and for PT or sports medicine after she obtains her [facility] card.  Patient advised to return for complete physical with lab work.  No Known Allergies Past Medical History  Diagnosis Date  . Chronic back pain    Current Outpatient Prescriptions on File Prior to Visit  Medication Sig Dispense Refill  . ibuprofen (ADVIL,MOTRIN) 200 MG tablet Take 800 mg by mouth every 3 (three) hours as needed for pain.       . Aspirin-Salicylamide-Caffeine (BC HEADACHE POWDER PO) Take 1 packet by mouth daily as needed (for pain).      Marland Kitchen HYDROcodone-acetaminophen (NORCO/VICODIN) 5-325 MG per tablet Take 1 tablet by mouth every 6 (six) hours as needed for pain (Take 1 - 2 tablets every 4 - 6 hours.).  20 tablet  0  . methocarbamol (ROBAXIN) 750 MG tablet Take 1 tablet (750 mg total) by mouth 4 (four) times daily as needed (Take 1 tablet every 6 hours as needed for muscle spasms.).  20 tablet  0  . naproxen (NAPROSYN) 500 MG tablet Take 1 tablet (500 mg total) by mouth 2 (two) times daily as needed.  30 tablet  0   No current facility-administered medications on file prior to visit.   No family history on file. History   Social History  . Marital Status: Single    Spouse Name: N/A    Number of Children: N/A  . Years of Education: N/A   Occupational History  . Not on file.   Social History Main Topics  . Smoking status: Current Every Day Smoker  . Smokeless tobacco: Not on file  . Alcohol Use: Yes  . Drug Use: Not on file  . Sexually Active: Not on file   Other Topics Concern  . Not on file   Social History Narrative  . No narrative on file    Review of Systems  ______ Constitutional: Negative for fever, chills, diaphoresis, activity change, appetite change and fatigue. ____ HENT: Negative for ear pain, nosebleeds, congestion, facial swelling, rhinorrhea, neck pain, neck stiffness and ear discharge.  ____ Eyes: Negative for pain, discharge, redness, itching and visual disturbance. ____ Respiratory: Negative for cough, choking, chest tightness, shortness of breath, wheezing and stridor.  ____ Cardiovascular: Negative for chest pain, palpitations and leg swelling. ____ Gastrointestinal: Negative for abdominal distention. ____ Genitourinary: Negative for dysuria, urgency, frequency, hematuria, flank pain, decreased urine volume, difficulty urinating and dyspareunia. ____ Musculoskeletal: Negative for back pain, joint swelling, arthralgias and gait problem. ________ Neurological: Negative for dizziness, tremors, seizures, syncope, facial asymmetry, speech difficulty, weakness, light-headedness, numbness and headaches. ____ Hematological: Negative for adenopathy. Does not bruise/bleed easily. ____ Psychiatric/Behavioral: Negative for hallucinations, behavioral problems, confusion, dysphoric mood, decreased concentration and agitation. ______   Objective:   Filed Vitals:   05/16/13 1725  BP: 138/85  Pulse: 78  Temp: 98 F (36.7 C)    Physical Exam ______ Constitutional: Appears well-developed and well-nourished. No distress. ____ HENT: Normocephalic. External right and left ear normal. Oropharynx is clear and moist. ____ Eyes: Conjunctivae and EOM are normal. PERRLA, no scleral icterus. ____ Neck: Normal ROM. Neck supple. No JVD. No tracheal  deviation. No thyromegaly. ____ CVS: RRR, S1/S2 +, no murmurs, no gallops, no carotid bruit.  Pulmonary: Effort and breath sounds normal, no stridor, rhonchi, wheezes, rales.  Abdominal: Soft. BS +,  no distension, tenderness, rebound or guarding. ________ Musculoskeletal: Normal range of motion. No edema and  no tenderness. ____ Lymphadenopathy: No lymphadenopathy noted, cervical, inguinal. Neuro: Alert. Normal reflexes, muscle tone coordination. No cranial nerve deficit. Skin: Skin is warm and dry. No rash noted. Not diaphoretic. No erythema. No pallor. ____ Psychiatric: Normal mood and affect. Behavior, judgment, thought content normal. __  Lab Results  Component Value Date   WBC 18.7* 08/06/2008   HGB 10.6 DELTA CHECK NOTED* 08/06/2008   HCT 31.7* 08/06/2008   MCV 96.1 08/06/2008   PLT 290 08/06/2008   No results found for this basename: CREATININE, BUN, NA, K, CL, CO2    No results found for this basename: HGBA1C   Lipid Panel  No results found for this basename: chol, trig, hdl, cholhdl, vldl, ldlcalc       Assessment and plan:   Chronic back pain - Plan: DG Lumbar Spine Complete  Sciatica of right side - Plan: DG Lumbar Spine Complete  Follow xray results  Refer to sports medicine for further evaluation  Continue ibuprofen as needed for pain  The patient was counseled on the dangers of tobacco use, and was advised to quit.  Reviewed strategies to maximize success, including removing cigarettes and smoking materials from environment and stress management.  The patient was given clear instructions to go to ER or return to medical center if symptoms don't improve, worsen or new problems develop.  The patient verbalized understanding.  The patient was told to call to get any lab results if not heard anything in the next week.    followup scheduled  Rodney Langton, MD, CDE, FAAFP [facility] [place], [place]

## 2013-05-29 ENCOUNTER — Ambulatory Visit (HOSPITAL_COMMUNITY)
Admission: RE | Admit: 2013-05-29 | Discharge: 2013-05-29 | Disposition: A | Discharge status: Home / Self Care | Payer: Self-pay | Source: Ambulatory Visit | Attending: Family Medicine | Admitting: Family Medicine

## 2013-05-29 DIAGNOSIS — G8929 Other chronic pain: Secondary | ICD-10-CM | POA: Insufficient documentation

## 2013-05-29 DIAGNOSIS — M47817 Spondylosis without myelopathy or radiculopathy, lumbosacral region: Secondary | ICD-10-CM | POA: Insufficient documentation

## 2013-05-29 DIAGNOSIS — M5431 Sciatica, right side: Secondary | ICD-10-CM

## 2013-05-29 DIAGNOSIS — M545 Low back pain, unspecified: Secondary | ICD-10-CM | POA: Insufficient documentation

## 2013-05-30 NOTE — Progress Notes (Signed)
Quick Note:  Please inform patient that xray reveals Minimal degenerative changes. No acute abnormality seen in the lumbar spine.  Rodney Langton, MD, CDE, FAAFP [facility] [place], [place]   ______

## 2013-05-31 ENCOUNTER — Telehealth: Payer: Self-pay | Admitting: *Deleted

## 2013-05-31 NOTE — Telephone Encounter (Signed)
05/31/13 Patient made aware of X-Ray results per Dr. Laural Benes.     Please inform patient that xray reveals Minimal degenerative changes. No acute abnormality seen in the lumbar spine   P.[facility] BSN [facility]

## 2013-06-10 ENCOUNTER — Ambulatory Visit: Payer: No Typology Code available for payment source | Attending: Internal Medicine | Admitting: Internal Medicine

## 2013-06-10 ENCOUNTER — Ambulatory Visit: Payer: Self-pay | Attending: Internal Medicine

## 2013-06-10 ENCOUNTER — Other Ambulatory Visit (HOSPITAL_COMMUNITY)
Admission: RE | Admit: 2013-06-10 | Discharge: 2013-06-10 | Disposition: A | Discharge status: Home / Self Care | Payer: No Typology Code available for payment source | Source: Ambulatory Visit | Attending: Internal Medicine | Admitting: Internal Medicine

## 2013-06-10 VITALS — BP 152/80 | HR 90 | Temp 97.9°F | Resp 16 | Ht 64.96 in | Wt 155.5 lb

## 2013-06-10 DIAGNOSIS — M5430 Sciatica, unspecified side: Secondary | ICD-10-CM

## 2013-06-10 DIAGNOSIS — G8929 Other chronic pain: Secondary | ICD-10-CM

## 2013-06-10 DIAGNOSIS — M549 Dorsalgia, unspecified: Secondary | ICD-10-CM

## 2013-06-10 DIAGNOSIS — Z124 Encounter for screening for malignant neoplasm of cervix: Secondary | ICD-10-CM

## 2013-06-10 DIAGNOSIS — Z01419 Encounter for gynecological examination (general) (routine) without abnormal findings: Secondary | ICD-10-CM | POA: Insufficient documentation

## 2013-06-10 DIAGNOSIS — M5431 Sciatica, right side: Secondary | ICD-10-CM

## 2013-06-10 DIAGNOSIS — Z129 Encounter for screening for malignant neoplasm, site unspecified: Secondary | ICD-10-CM

## 2013-06-10 MED ORDER — HYDROCORTISONE 0.5 % EX CREA: TOPICAL_CREAM | Freq: Two times a day (BID) | CUTANEOUS | Status: DC

## 2013-06-10 MED ORDER — CYCLOBENZAPRINE HCL 10 MG PO TABS: 10.0000 mg | ORAL_TABLET | Freq: Three times a day (TID) | ORAL | Status: DC | PRN

## 2013-06-10 MED ORDER — TRAMADOL HCL 50 MG PO TABS: 50.0000 mg | ORAL_TABLET | Freq: Three times a day (TID) | ORAL | Status: DC | PRN

## 2013-06-10 NOTE — Progress Notes (Signed)
Patient ID: Kelsey West, female   DOB: 1970-10-31, 42 y.o.   MRN: [id number]  CC: followup visit  HPI: Kelsey West is here today for follow up visit as well as for Pap smear. She complained of low back pain as she still has numbness on her right foot, she said it makes it difficult to walk. No history of fall. She also complained of a rash on her face that has been there for about 6 months, nonspecific multiple joint pains at different times. She continue to smoke cigarette.  No Known Allergies Past Medical History  Diagnosis Date  . Chronic back pain    Current Outpatient Prescriptions on File Prior to Visit  Medication Sig Dispense Refill  . ibuprofen (ADVIL,MOTRIN) 200 MG tablet Take 800 mg by mouth every 3 (three) hours as needed for pain.        No current facility-administered medications on file prior to visit.   Family History  Problem Relation Age of Onset  . Thyroid disease     History   Social History  . Marital Status: Single    Spouse Name: N/A    Number of Children: 3  . Years of Education: 14   Occupational History  . Applied disability     Social History Main Topics  . Smoking status: Current Every Day Smoker  . Smokeless tobacco: Not on file  . Alcohol Use: Yes  . Drug Use: No  . Sexual Activity: Not on file   Other Topics Concern  . Not on file   Social History Narrative  . No narrative on file    Review of Systems: Constitutional: Negative for fever, chills, diaphoresis, activity change, appetite change and fatigue. HENT: Negative for ear pain, nosebleeds, congestion, facial swelling, rhinorrhea, neck pain, neck stiffness and ear discharge.  Eyes: Negative for pain, discharge, redness, itching and visual disturbance. Respiratory: Negative for cough, choking, chest tightness, shortness of breath, wheezing and stridor.  Cardiovascular: Negative for chest pain, palpitations and leg swelling. Gastrointestinal: Negative for abdominal  distention. Genitourinary: Negative for dysuria, urgency, frequency, hematuria, flank pain, decreased urine volume, difficulty urinating and dyspareunia.  Musculoskeletal: Low back pain, and pain around the waist Neurological: numbness especially right foot and toes Hematological: Negative for adenopathy. Does not bruise/bleed easily. Psychiatric/Behavioral: Negative for hallucinations, behavioral problems, confusion, dysphoric mood, decreased concentration and agitation.    Objective:   Filed Vitals:   06/10/13 1513  BP: 152/80  Pulse: 90  Temp: 97.9 F (36.6 C)  Resp: 16    Physical Exam: Constitutional: Patient appears well-developed and well-nourished. No distress. Facial rash/malar rash+ HENT: Normocephalic, atraumatic, External right and left ear normal. Oropharynx is clear and moist.  Eyes: Conjunctivae and EOM are normal. PERRLA, no scleral icterus. Neck: Normal ROM. Neck supple. No JVD. No tracheal deviation. No thyromegaly. CVS: RRR, S1/S2 +, no murmurs, no gallops, no carotid bruit.  Pulmonary: Effort and breath sounds normal, no stridor, rhonchi, wheezes, rales.  Abdominal: Soft. BS +,  no distension, tenderness, rebound or guarding.  Musculoskeletal: Normal range of motion. No edema and no tenderness.  Lymphadenopathy: No lymphadenopathy noted, cervical, inguinal or axillary Neuro: Alert. Subjective lack of proprioception right big toe, No cranial nerve deficit. Skin: Mottled skin with reticulate pattern Psychiatric: Normal mood and affect. Behavior, judgment, thought content normal. Pelvic Exam: normal female external genitalia, slightly erythematous cervical opening, bled easily to touch, minimal discharge, cervical motion tenderness negative  Lab Results  Component Value Date   WBC 18.7*  08/06/2008   HGB 10.6 DELTA CHECK NOTED* 08/06/2008   HCT 31.7* 08/06/2008   MCV 96.1 08/06/2008   PLT 290 08/06/2008   No results found for this basename: CREATININE, BUN,  NA, K, CL, CO2    No results found for this basename: HGBA1C   Lipid Panel  No results found for this basename: chol, trig, hdl, cholhdl, vldl, ldlcalc       Assessment and plan:   Patient Active Problem List   Diagnosis Date Noted  . Encounter for Pap smear 06/10/2013  . Chronic back pain 05/16/2013  . Sciatica of right side 05/16/2013   Tramadol 50 mg tablet by mouth every 6 when necessary pain Flexeril 10 mg tablet by mouth 3 times a day when necessary pain Hydrocortisone cream 0.5% applied to rash twice a day  Labs: Pap Smear done, cervical cytology GC/Chlamydia and wet mount ANA to screen for autoimmune disease in the setting of rash and joint pains Rheumatoid factor Hepatitis panel To rule out hepatitis C  Will get MRI of the Lumbar spine  Kelsey West was given clear instructions to go to ER or return to the clinic if symptoms don't improve, worsen or new problems develop.  Kelsey West verbalized understanding.  Kelsey West was told to call to get lab results if hasn't heard anything in the next week.        Jeanann Lewandowsky, MD [facility] [place], [place] [phone]   06/10/2013, 3:55 PM

## 2013-06-10 NOTE — Progress Notes (Signed)
Pt is here for her 42 y/o PE Would like to provider about recent x-rays and redness around nose and legs  Also c/o bilateral feet numbness  Alert w/no signs of acute distress.

## 2013-06-11 LAB — HEPATITIS PANEL, ACUTE
HCV Ab: NEGATIVE
Hep A IgM: NEGATIVE
Hep B C IgM: NEGATIVE

## 2013-06-11 LAB — RHEUMATOID FACTOR: Rheumatoid fact SerPl-aCnc: <10 IU/mL (ref <=14)

## 2013-06-11 LAB — ANA: Anti Nuclear Antibody(ANA): NEGATIVE

## 2013-06-12 ENCOUNTER — Telehealth: Payer: Self-pay

## 2013-06-12 NOTE — Telephone Encounter (Signed)
Patient is aware of her lab result

## 2013-06-13 ENCOUNTER — Telehealth: Payer: Self-pay

## 2013-06-13 NOTE — Telephone Encounter (Signed)
Message copied by Lestine Mount on Thu Jun 13, 2013  8:36 AM ------      Message from: Jeanann Lewandowsky E      Created: Wed Jun 12, 2013  2:19 PM       Please call patient to inform her that her Pap smear is negative for malignancy, that is,  Normal ------

## 2013-06-13 NOTE — Telephone Encounter (Signed)
Left message on machine about normal results

## 2013-06-18 ENCOUNTER — Ambulatory Visit (HOSPITAL_COMMUNITY)
Admission: RE | Admit: 2013-06-18 | Discharge: 2013-06-18 | Disposition: A | Discharge status: Home / Self Care | Payer: No Typology Code available for payment source | Source: Ambulatory Visit | Attending: Internal Medicine | Admitting: Internal Medicine

## 2013-06-18 DIAGNOSIS — M545 Low back pain, unspecified: Secondary | ICD-10-CM | POA: Insufficient documentation

## 2013-06-18 DIAGNOSIS — M79609 Pain in unspecified limb: Secondary | ICD-10-CM | POA: Insufficient documentation

## 2013-06-18 DIAGNOSIS — M5431 Sciatica, right side: Secondary | ICD-10-CM

## 2013-06-18 DIAGNOSIS — G8929 Other chronic pain: Secondary | ICD-10-CM

## 2013-06-18 DIAGNOSIS — M25559 Pain in unspecified hip: Secondary | ICD-10-CM | POA: Insufficient documentation

## 2013-06-18 DIAGNOSIS — R209 Unspecified disturbances of skin sensation: Secondary | ICD-10-CM | POA: Insufficient documentation

## 2013-08-22 ENCOUNTER — Other Ambulatory Visit: Payer: Self-pay

## 2013-11-20 ENCOUNTER — Encounter: Payer: Self-pay | Admitting: Family

## 2013-11-20 ENCOUNTER — Ambulatory Visit (INDEPENDENT_AMBULATORY_CARE_PROVIDER_SITE_OTHER): Payer: Self-pay | Admitting: Family

## 2013-11-20 VITALS — BP 142/92 | HR 97 | Ht 65.0 in | Wt 168.0 lb

## 2013-11-20 DIAGNOSIS — M545 Low back pain, unspecified: Secondary | ICD-10-CM

## 2013-11-20 DIAGNOSIS — M5430 Sciatica, unspecified side: Secondary | ICD-10-CM

## 2013-11-20 DIAGNOSIS — M5431 Sciatica, right side: Secondary | ICD-10-CM

## 2013-11-20 DIAGNOSIS — G8929 Other chronic pain: Secondary | ICD-10-CM

## 2013-11-20 DIAGNOSIS — L819 Disorder of pigmentation, unspecified: Secondary | ICD-10-CM

## 2013-11-20 LAB — CBC WITH DIFFERENTIAL/PLATELET
BASOS PCT: 0.4 % (ref 0.0–3.0)
Basophils Absolute: 0.1 10*3/uL (ref 0.0–0.1)
EOS PCT: 0.9 % (ref 0.0–5.0)
Eosinophils Absolute: 0.2 10*3/uL (ref 0.0–0.7)
HEMATOCRIT: 39.8 % (ref 36.0–46.0)
HEMOGLOBIN: 12.9 g/dL (ref 12.0–15.0)
LYMPHS ABS: 2.4 10*3/uL (ref 0.7–4.0)
LYMPHS PCT: 14.2 % (ref 12.0–46.0)
MCHC: 32.5 g/dL (ref 30.0–36.0)
MCV: 93.6 fl (ref 78.0–100.0)
MONOS PCT: 4.9 % (ref 3.0–12.0)
Monocytes Absolute: 0.8 10*3/uL (ref 0.1–1.0)
NEUTROS ABS: 13.4 10*3/uL — AB (ref 1.4–7.7)
Neutrophils Relative %: 79.6 % — ABNORMAL HIGH (ref 43.0–77.0)
Platelets: 461 10*3/uL — ABNORMAL HIGH (ref 150.0–400.0)
RBC: 4.25 Mil/uL (ref 3.87–5.11)
RDW: 14.1 % (ref 11.5–14.6)
WBC: 16.8 10*3/uL — AB (ref 4.5–10.5)

## 2013-11-20 MED ORDER — CYCLOBENZAPRINE HCL 10 MG PO TABS: 10.0000 mg | ORAL_TABLET | Freq: Three times a day (TID) | ORAL | Status: DC | PRN

## 2013-11-20 MED ORDER — TRAMADOL HCL 50 MG PO TABS: 50.0000 mg | ORAL_TABLET | Freq: Three times a day (TID) | ORAL | Status: DC | PRN

## 2013-11-20 NOTE — Patient Instructions (Signed)

## 2013-11-20 NOTE — Progress Notes (Signed)
Pre visit review using our clinic review tool, if applicable. No additional management support is needed unless otherwise documented below in the visit note. 
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
## 2013-11-21 LAB — COMPREHENSIVE METABOLIC PANEL
ALBUMIN: 4.1 g/dL (ref 3.5–5.2)
ALT: 9 U/L (ref 0–35)
AST: 14 U/L (ref 0–37)
Alkaline Phosphatase: 76 U/L (ref 39–117)
BUN: 9 mg/dL (ref 6–23)
CALCIUM: 9.3 mg/dL (ref 8.4–10.5)
CHLORIDE: 107 meq/L (ref 96–112)
CO2: 24 meq/L (ref 19–32)
CREATININE: 0.6 mg/dL (ref 0.4–1.2)
GFR: 122.37 mL/min (ref >60.00)
GLUCOSE: 85 mg/dL (ref 70–99)
POTASSIUM: 4.2 meq/L (ref 3.5–5.1)
Sodium: 142 mEq/L (ref 135–145)
Total Bilirubin: 0.5 mg/dL (ref 0.3–1.2)
Total Protein: 8 g/dL (ref 6.0–8.3)

## 2013-11-21 LAB — TSH: TSH: 1.79 u[IU]/mL (ref 0.35–5.50)

## 2013-11-21 LAB — ANA: Anti Nuclear Antibody(ANA): NEGATIVE

## 2013-11-22 ENCOUNTER — Telehealth: Payer: Self-pay | Admitting: Family

## 2013-11-22 ENCOUNTER — Other Ambulatory Visit: Payer: Self-pay | Admitting: Family

## 2013-11-22 DIAGNOSIS — D72829 Elevated white blood cell count, unspecified: Secondary | ICD-10-CM

## 2013-11-22 NOTE — Telephone Encounter (Signed)
Relevant patient education assigned to patient using Emmi. ° °

## 2013-11-25 ENCOUNTER — Telehealth: Payer: Self-pay | Admitting: Internal Medicine

## 2013-11-25 ENCOUNTER — Other Ambulatory Visit: Payer: Self-pay | Admitting: *Deleted

## 2013-11-25 DIAGNOSIS — D72829 Elevated white blood cell count, unspecified: Secondary | ICD-10-CM

## 2013-11-25 NOTE — Telephone Encounter (Signed)
LEFT MESSAGE FOR PATIENT TO RETURN CALL TO SCHEDULE NEW PATIENT APPT.  °

## 2013-11-25 NOTE — Telephone Encounter (Signed)
NEW PATIENT SCHEDULED FOR 02/10 @ 11 W/DR. MOHAMED.  REFERRING DR. [place]

## 2013-11-25 NOTE — Telephone Encounter (Signed)
C/D 11/25/13 for appt. 11/26/13 °

## 2013-11-26 ENCOUNTER — Ambulatory Visit: Payer: Self-pay

## 2013-11-26 ENCOUNTER — Other Ambulatory Visit (HOSPITAL_BASED_OUTPATIENT_CLINIC_OR_DEPARTMENT_OTHER): Payer: Self-pay

## 2013-11-26 ENCOUNTER — Encounter: Payer: Self-pay | Admitting: Internal Medicine

## 2013-11-26 ENCOUNTER — Ambulatory Visit (HOSPITAL_BASED_OUTPATIENT_CLINIC_OR_DEPARTMENT_OTHER): Payer: Self-pay | Admitting: Internal Medicine

## 2013-11-26 ENCOUNTER — Telehealth: Payer: Self-pay | Admitting: Family

## 2013-11-26 ENCOUNTER — Telehealth: Payer: Self-pay | Admitting: Internal Medicine

## 2013-11-26 VITALS — BP 163/90 | HR 97 | Temp 97.5°F | Resp 19 | Ht 65.0 in | Wt 164.6 lb

## 2013-11-26 DIAGNOSIS — D72829 Elevated white blood cell count, unspecified: Secondary | ICD-10-CM

## 2013-11-26 DIAGNOSIS — F17200 Nicotine dependence, unspecified, uncomplicated: Secondary | ICD-10-CM

## 2013-11-26 LAB — COMPREHENSIVE METABOLIC PANEL (CC13)
ALBUMIN: 4 g/dL (ref 3.5–5.0)
ALK PHOS: 94 U/L (ref 40–150)
ALT: 11 U/L (ref 0–55)
AST: 15 U/L (ref 5–34)
Anion Gap: 12 mEq/L — ABNORMAL HIGH (ref 3–11)
BUN: 5.5 mg/dL — AB (ref 7.0–26.0)
CALCIUM: 9.8 mg/dL (ref 8.4–10.4)
CHLORIDE: 105 meq/L (ref 98–109)
CO2: 25 mEq/L (ref 22–29)
Creatinine: 0.7 mg/dL (ref 0.6–1.1)
Glucose: 117 mg/dl (ref 70–140)
POTASSIUM: 3.8 meq/L (ref 3.5–5.1)
SODIUM: 142 meq/L (ref 136–145)
TOTAL PROTEIN: 8 g/dL (ref 6.4–8.3)
Total Bilirubin: 0.46 mg/dL (ref 0.20–1.20)

## 2013-11-26 LAB — CBC WITH DIFFERENTIAL/PLATELET
BASO%: 0.2 % (ref 0.0–2.0)
BASOS ABS: 0 10*3/uL (ref 0.0–0.1)
EOS ABS: 0.1 10*3/uL (ref 0.0–0.5)
EOS%: 0.8 % (ref 0.0–7.0)
HCT: 39.4 % (ref 34.8–46.6)
HEMOGLOBIN: 12.8 g/dL (ref 11.6–15.9)
LYMPH#: 2 10*3/uL (ref 0.9–3.3)
LYMPH%: 11.4 % — ABNORMAL LOW (ref 14.0–49.7)
MCH: 29.7 pg (ref 25.1–34.0)
MCHC: 32.5 g/dL (ref 31.5–36.0)
MCV: 91.4 fL (ref 79.5–101.0)
MONO#: 0.8 10*3/uL (ref 0.1–0.9)
MONO%: 4.5 % (ref 0.0–14.0)
NEUT%: 83.1 % — ABNORMAL HIGH (ref 38.4–76.8)
NEUTROS ABS: 14.4 10*3/uL — AB (ref 1.5–6.5)
Platelets: 514 10*3/uL — ABNORMAL HIGH (ref 145–400)
RBC: 4.31 10*6/uL (ref 3.70–5.45)
RDW: 13.1 % (ref 11.2–14.5)
WBC: 17.3 10*3/uL — ABNORMAL HIGH (ref 3.9–10.3)

## 2013-11-26 LAB — LACTATE DEHYDROGENASE (CC13): LDH: 167 U/L (ref 125–245)

## 2013-11-26 LAB — TECHNOLOGIST REVIEW

## 2013-11-26 NOTE — Telephone Encounter (Signed)
gv adn printed appt sched and avs for pt for March....sed pt to lab

## 2013-11-26 NOTE — Progress Notes (Signed)
[facility] Telephone:([phone]) [phone]   Fax:([phone]  CONSULT NOTE  REFERRING PHYSICIAN: Roxy Cedar. FNP  REASON FOR CONSULTATION:  43 years old white female with persistent leukocytosis.  HPI Kelsey West is a 43 y.o. female with a past medical history significant for chronic back pain as well as long history of smoking. The patient was seen by her primary care physician for evaluation of low back pain with radiation to the legs. Bloodwork performed at that time onto first 2015 showed elevated white blood count of 16.8 with an absolute neutrophil count of 13,400, with elevated platelets count of 461,000. The patient has normal hemoglobin of 12.9 and hematocrit 39.8%. Her CBC on 08/06/2008 showed elevated white blood count of 18.7 but normal platelets count of 290,000. The patient was referred to me today for further evaluation and recommendation regarding her condition. Reviewing her previous records from the hospital as well as the primary care physician the chart showed the persistent elevation of white blood count at least since 2008. The patient denied having any significant complaints today except for fatigue and back pain. She denied having any history of recurrent infection rheumatologic disorder. She is not currently on any steroid treatment except for occasional rectal treatment with hydrocortisone cream for dry skin on the face.  She denied having any significant weight loss but has occasional night sweats. She also has some numbness in the toes bilaterally. She has occasional chest pain but no significant shortness breath, cough or hemoptysis. She also complains of occasional headache and sore throat. Family history is significant for a father who died from ileitis when she was 43 years old. Her mother had thyroid disease and a brother with polyarthritis nodosa.  The patient is single and has 3 children ages 45, 14 and 55. 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 She has a history of smoking half a pack per day for around 3 years and unfortunately she continues to smoke and I strongly encouraged her to quit smoking and offered her smoke cessation counseling. She has no history of alcohol or drug abuse. HPI  Past Medical History  Diagnosis Date  . Chronic back pain     Past Surgical History  Procedure Laterality Date  . Hiatal hernia repair    . Tubal ligation    . 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 Thyroid disease      Social History History  Substance Use Topics  . Smoking status: Current Every Day Smoker  . Smokeless tobacco: Not on file  . Alcohol Use: Yes    No Known Allergies  Current Outpatient Prescriptions  Medication Sig Dispense Refill  . cyclobenzaprine (FLEXERIL) 10 MG tablet Take 1 tablet (10 mg total) by mouth 3 (three) times daily as needed for muscle spasms.  30 tablet  0  . hydrocortisone cream 0.5 % Apply topically 2 (two) times daily.  30 g  0  . ibuprofen (ADVIL,MOTRIN) 200 MG tablet Take 800 mg by mouth every 3 (three) hours as needed for pain.       . traMADol (ULTRAM) 50 MG tablet Take 1 tablet (50 mg total) by mouth every 8 (eight) hours as needed.  30 tablet  0   No current facility-administered medications for this visit.    Review of Systems  Constitutional: positive for fatigue Eyes: negative Ears, nose,  mouth, throat, and face: negative Respiratory: negative Cardiovascular: negative Gastrointestinal: negative Genitourinary:negative Integument/breast: negative Hematologic/lymphatic: negative Musculoskeletal:positive for back pain Neurological: negative Behavioral/Psych: negative Endocrine: negative Allergic/Immunologic: negative  Physical Exam  NIO:EVOJJ, healthy, no distress, well nourished and well developed SKIN: skin color, texture, turgor are normal, no rashes or significant  lesions HEAD: Normocephalic, No masses, lesions, tenderness or abnormalities EYES: normal, PERRLA EARS: External ears normal, Canals clear OROPHARYNX:no exudate, no erythema and lips, buccal mucosa, and tongue normal  NECK: supple, no adenopathy LYMPH:  no palpable lymphadenopathy, no hepatosplenomegaly BREAST:not examined LUNGS: clear to auscultation , and palpation HEART: regular rate & rhythm, no murmurs and no gallops ABDOMEN:abdomen soft, non-tender, normal bowel sounds and no masses or organomegaly BACK: Back symmetric, no curvature., No CVA tenderness EXTREMITIES:no joint deformities, effusion, or inflammation, no edema, no skin discoloration  NEURO: alert & oriented x 3 with fluent speech, no focal motor/sensory deficits  PERFORMANCE STATUS: ECOG 1  LABORATORY DATA: Lab Results  Component Value Date   WBC 17.3* 11/26/2013   HGB 12.8 11/26/2013   HCT 39.4 11/26/2013   MCV 91.4 11/26/2013   PLT 514* 11/26/2013      Chemistry      Component Value Date/Time   NA 142 11/26/2013 1135   NA 142 11/20/2013 1036   K 3.8 11/26/2013 1135   K 4.2 11/20/2013 1036   CL 107 11/20/2013 1036   CO2 25 11/26/2013 1135   CO2 24 11/20/2013 1036   BUN 5.5* 11/26/2013 1135   BUN 9 11/20/2013 1036   CREATININE 0.7 11/26/2013 1135   CREATININE 0.6 11/20/2013 1036      Component Value Date/Time   CALCIUM 9.8 11/26/2013 1135   CALCIUM 9.3 11/20/2013 1036   ALKPHOS 94 11/26/2013 1135   ALKPHOS 76 11/20/2013 1036   AST 15 11/26/2013 1135   AST 14 11/20/2013 1036   ALT 11 11/26/2013 1135   ALT 9 11/20/2013 1036   BILITOT 0.46 11/26/2013 1135   BILITOT 0.5 11/20/2013 1036       RADIOGRAPHIC STUDIES: No results found.  ASSESSMENT: This is a very pleasant 43 years old white female with persistent leukocytosis of unclear etiology at this point but could be reactive in nature but myeloproliferative disorder cannot be ruled out at this point especially with the persistent leukocytosis and thrombocytosis in the absence of  any other underlying etiology.    PLAN: I had a lengthy discussion with the patient today about her condition. I ordered several studies to evaluate her condition including repeat CBC, comprehensive metabolic panel, LDH. I would also ordered a molecular study for BCR/ABL to rule out chronic myeloid leukemia in addition to JAK-2 mutation to rule out essential thrombocythemia or other myeloproliferative disorder. I will see the patient back for followup visit in 3 weeks for evaluation and discussion of her lab results and recommendation regarding her condition. She was advised to call immediately if she has any concerning symptoms in the interval. For the chronic back pain, the patient will continue on tramadol as prescribed by her primary care physician.  The patient voices understanding of current disease status and treatment options and is in agreement with the current care plan.  All questions were answered. The patient knows to call the clinic with any problems, questions or concerns. We can certainly see the patient much sooner if necessary.  Thank you so much for allowing me to participate in the care of [place]. I will continue to follow up the patient  with you and assist in her care.  I spent 40 minutes counseling the patient face to face. The total time spent in the appointment was 60 minutes.  Disclaimer: This note was dictated with voice recognition software. Similar sounding words can inadvertently be transcribed and may not be corrected upon review.   Krithi Bray K. 11/26/2013, 12:39 PM

## 2013-11-26 NOTE — Progress Notes (Signed)
Checked in new patient --she is self pay. No insurance so I gave her ph# for insurance and application for medicaid. She has not been to [place] and she has appt card.

## 2013-11-26 NOTE — Telephone Encounter (Signed)
Pt is calling regarding rx traMADol (ULTRAM) 50 MG tablet, pt states the meds is not helping with pain, however she is experiencing a burning in her stomach, pt wants to know what other options is available for her.

## 2013-11-26 NOTE — Telephone Encounter (Signed)
Advised pt that [facility] will not Rx any other pain medications for her. Provided pt with the [facility] to [facility] as that is where her records were sent for pain clinic referral

## 2013-11-26 NOTE — Patient Instructions (Signed)
Smoking Cessation Quitting smoking is important to your health and has many advantages. However, it is not always easy to quit since nicotine is a very addictive drug. Often times, people try 3 times or more before being able to quit. This document explains the best ways for you to prepare to quit smoking. Quitting takes hard work and a lot of effort, but you can do it. ADVANTAGES OF QUITTING SMOKING  You will live longer, feel better, and live better.  Your body will feel the impact of quitting smoking almost immediately.  Within 20 minutes, blood pressure decreases. Your pulse returns to its normal level.  After 8 hours, carbon monoxide levels in the blood return to normal. Your oxygen level increases.  After 24 hours, the chance of having a heart attack starts to decrease. Your breath, hair, and body stop smelling like smoke.  After 48 hours, damaged nerve endings begin to recover. Your sense of taste and smell improve.  After 72 hours, the body is virtually free of nicotine. Your bronchial tubes relax and breathing becomes easier.  After 2 to 12 weeks, lungs can hold more air. Exercise becomes easier and circulation improves.  The risk of having a heart attack, stroke, cancer, or lung disease is greatly reduced.  After 1 year, the risk of coronary heart disease is cut in half.  After 5 years, the risk of stroke falls to the same as a nonsmoker.  After 10 years, the risk of lung cancer is cut in half and the risk of other cancers decreases significantly.  After 15 years, the risk of coronary heart disease drops, usually to the level of a nonsmoker.  If you are pregnant, quitting smoking will improve your chances of having a healthy baby.  The people you live with, especially any children, will be healthier.  You will have extra money to spend on things other than cigarettes. QUESTIONS TO THINK ABOUT BEFORE ATTEMPTING TO QUIT You may want to talk about your answers with your  caregiver.  Why do you want to quit?  If you tried to quit in the past, what helped and what did not?  What will be the most difficult situations for you after you quit? How will you plan to handle them?  Who can help you through the tough times? Your family? Friends? A caregiver?  What pleasures do you get from smoking? What ways can you still get pleasure if you quit? Here are some questions to ask your caregiver:  How can you help me to be successful at quitting?  What medicine do you think would be best for me and how should I take it?  What should I do if I need more help?  What is smoking withdrawal like? How can I get information on withdrawal? GET READY  Set a quit date.  Change your environment by getting rid of all cigarettes, ashtrays, matches, and lighters in your home, car, or work. Do not let people smoke in your home.  Review your past attempts to quit. Think about what worked and what did not. GET SUPPORT AND ENCOURAGEMENT You have a better chance of being successful if you have help. You can get support in many ways.  Tell your family, friends, and co-workers that you are going to quit and need their support. Ask them not to smoke around you.  Get individual, group, or telephone counseling and support. Programs are available at local hospitals and health centers. Call your local health department for   information about programs in your area.  Spiritual beliefs and practices may help some smokers quit.  Download a "quit meter" on your computer to keep track of quit statistics, such as how long you have gone without smoking, cigarettes not smoked, and money saved.  Get a self-help book about quitting smoking and staying off of tobacco. LEARN NEW SKILLS AND BEHAVIORS  Distract yourself from urges to smoke. Talk to someone, go for a walk, or occupy your time with a task.  Change your normal routine. Take a different route to work. Drink tea instead of coffee.  Eat breakfast in a different place.  Reduce your stress. Take a hot bath, exercise, or read a book.  Plan something enjoyable to do every day. Reward yourself for not smoking.  Explore interactive web-based programs that specialize in helping you quit. GET MEDICINE AND USE IT CORRECTLY Medicines can help you stop smoking and decrease the urge to smoke. Combining medicine with the above behavioral methods and support can greatly increase your chances of successfully quitting smoking.  Nicotine replacement therapy helps deliver nicotine to your body without the negative effects and risks of smoking. Nicotine replacement therapy includes nicotine gum, lozenges, inhalers, nasal sprays, and skin patches. Some may be available over-the-counter and others require a prescription.  Antidepressant medicine helps people abstain from smoking, but how this works is unknown. This medicine is available by prescription.  Nicotinic receptor partial agonist medicine simulates the effect of nicotine in your brain. This medicine is available by prescription. Ask your caregiver for advice about which medicines to use and how to use them based on your health history. Your caregiver will tell you what side effects to look out for if you choose to be on a medicine or therapy. Carefully read the information on the package. Do not use any other product containing nicotine while using a nicotine replacement product.  RELAPSE OR DIFFICULT SITUATIONS Most relapses occur within the first 3 months after quitting. Do not be discouraged if you start smoking again. Remember, most people try several times before finally quitting. You may have symptoms of withdrawal because your body is used to nicotine. You may crave cigarettes, be irritable, feel very hungry, cough often, get headaches, or have difficulty concentrating. The withdrawal symptoms are only temporary. They are strongest when you first quit, but they will go away within  10 14 days. To reduce the chances of relapse, try to:  Avoid drinking alcohol. Drinking lowers your chances of successfully quitting.  Reduce the amount of caffeine you consume. Once you quit smoking, the amount of caffeine in your body increases and can give you symptoms, such as a rapid heartbeat, sweating, and anxiety.  Avoid smokers because they can make you want to smoke.  Do not let weight gain distract you. Many smokers will gain weight when they quit, usually less than 10 pounds. Eat a healthy diet and stay active. You can always lose the weight gained after you quit.  Find ways to improve your mood other than smoking. FOR MORE INFORMATION  www.smokefree.gov  Document Released: 09/27/2001 Document Revised: 04/03/2012 Document Reviewed: 01/12/2012 [employer] Patient Information 2014 [employer].  

## 2013-12-17 ENCOUNTER — Encounter: Payer: Self-pay | Admitting: Internal Medicine

## 2013-12-17 ENCOUNTER — Encounter: Payer: Self-pay | Admitting: Family

## 2013-12-17 ENCOUNTER — Telehealth: Payer: Self-pay | Admitting: Family

## 2013-12-17 ENCOUNTER — Ambulatory Visit (HOSPITAL_BASED_OUTPATIENT_CLINIC_OR_DEPARTMENT_OTHER): Payer: Self-pay | Admitting: Internal Medicine

## 2013-12-17 VITALS — BP 153/87 | HR 77 | Temp 97.8°F | Resp 18 | Ht 65.0 in | Wt 170.3 lb

## 2013-12-17 DIAGNOSIS — D72829 Elevated white blood cell count, unspecified: Secondary | ICD-10-CM

## 2013-12-17 DIAGNOSIS — G8929 Other chronic pain: Secondary | ICD-10-CM

## 2013-12-17 DIAGNOSIS — M549 Dorsalgia, unspecified: Secondary | ICD-10-CM

## 2013-12-17 NOTE — Patient Instructions (Signed)
Smoking Cessation, Tips for Success If you are ready to quit smoking, congratulations! You have chosen to help yourself be healthier. Cigarettes bring nicotine, tar, carbon monoxide, and other irritants into your body. Your lungs, heart, and blood vessels will be able to work better without these poisons. There are many different ways to quit smoking. Nicotine gum, nicotine patches, a nicotine inhaler, or nicotine nasal spray can help with physical craving. Hypnosis, support groups, and medicines help break the habit of smoking. WHAT THINGS CAN I DO TO MAKE QUITTING EASIER?  Here are some tips to help you quit for good:  Pick a date when you will quit smoking completely. Tell all of your friends and family about your plan to quit on that date.  Do not try to slowly cut down on the number of cigarettes you are smoking. Pick a quit date and quit smoking completely starting on that day.  Throw away all cigarettes.   Clean and remove all ashtrays from your home, work, and car.   On a card, write down your reasons for quitting. Carry the card with you and read it when you get the urge to smoke.   Cleanse your body of nicotine. Drink enough water and fluids to keep your urine clear or pale yellow. Do this after quitting to flush the nicotine from your body.   Learn to predict your moods. Do not let a bad situation be your excuse to have a cigarette. Some situations in your life might tempt you into wanting a cigarette.   Never have "just one" cigarette. It leads to wanting another and another. Remind yourself of your decision to quit.   Change habits associated with smoking. If you smoked while driving or when feeling stressed, try other activities to replace smoking. Stand up when drinking your coffee. Brush your teeth after eating. Sit in a different chair when you read the paper. Avoid alcohol while trying to quit, and try to drink fewer caffeinated beverages. Alcohol and caffeine may urge  you to smoke.   Avoid foods and drinks that can trigger a desire to smoke, such as sugary or spicy foods and alcohol.   Ask people who smoke not to smoke around you.   Have something planned to do right after eating or having a cup of coffee. For example, plan to take a walk or exercise.   Try a relaxation exercise to calm you down and decrease your stress. Remember, you may be tense and nervous for the first 2 weeks after you quit, but this will pass.   Find new activities to keep your hands busy. Play with a pen, coin, or rubber band. Doodle or draw things on paper.   Brush your teeth right after eating. This will help cut down on the craving for the taste of tobacco after meals. You can also try mouthwash.   Use oral substitutes in place of cigarettes. Try using lemon drops, carrots, cinnamon sticks, or chewing gum. Keep them handy so they are available when you have the urge to smoke.   When you have the urge to smoke, try deep breathing.   Designate your home as a nonsmoking area.   If you are a heavy smoker, ask your health care provider about a prescription for nicotine chewing gum. It can ease your withdrawal from nicotine.   Reward yourself. Set aside the cigarette money you save and buy yourself something nice.   Look for support from others. Join a support group or   smoking cessation program. Ask someone at home or at work to help you with your plan to quit smoking.   Always ask yourself, "Do I need this cigarette or is this just a reflex?" Tell yourself, "Today, I choose not to smoke," or "I do not want to smoke." You are reminding yourself of your decision to quit.  Do not replace cigarette smoking with electronic cigarettes (commonly called e-cigarettes). The safety of e-cigarettes is unknown, and some may contain harmful chemicals.  If you relapse, do not give up! Plan ahead and think about what you will do the next time you get the urge to smoke.  HOW WILL  I FEEL WHEN I QUIT SMOKING? You may have symptoms of withdrawal because your body is used to nicotine (the addictive substance in cigarettes). You may crave cigarettes, be irritable, feel very hungry, cough often, get headaches, or have difficulty concentrating. The withdrawal symptoms are only temporary. They are strongest when you first quit but will go away within 10 14 days. When withdrawal symptoms occur, stay in control. Think about your reasons for quitting. Remind yourself that these are signs that your body is healing and getting used to being without cigarettes. Remember that withdrawal symptoms are easier to treat than the major diseases that smoking can cause.  Even after the withdrawal is over, expect periodic urges to smoke. However, these cravings are generally short lived and will go away whether you smoke or not. Do not smoke!  WHAT RESOURCES ARE AVAILABLE TO HELP ME QUIT SMOKING? Your health care provider can direct you to community resources or hospitals for support, which may include:  Group support.  Education.  Hypnosis.  Therapy. Document Released: 07/01/2004 Document Revised: 07/24/2013 Document Reviewed: 03/21/2013 [employer] Patient Information 2014 [employer].  

## 2013-12-17 NOTE — Progress Notes (Signed)
[facility] [phone]   [phone]  OFFICE PROGRESS NOTE  CAMPBELL, Hillard Danker, FNP [address] [place] [address]  DIAGNOSIS: Reactive leukocytosis. The patient has negative molecular study for BCR/ABL as well as JAK-2 mutation.  PRIOR THERAPY: None  CURRENT THERAPY: Observation  INTERVAL HISTORY: Kelsey West 43 y.o. female returns to the clinic today for followup visit accompanied by her mother. The patient is feeling fine today with no specific complaints except for the chronic back pain. She takes a lot of ibuprofen and Goody's powder. She denied having any significant nausea or vomiting, no fever or chills. She denied having any recent infection. She had several studies for evaluation of her persistent leukocytosis including molecular study for BCR/ABL as well as JAK-2 mutation and these were reported to be negative. She is here today for evaluation and discussion of her lab results.  MEDICAL HISTORY: Past Medical History  Diagnosis Date  . Chronic back pain     ALLERGIES:  has No Known Allergies.  MEDICATIONS:  Current Outpatient Prescriptions  Medication Sig Dispense Refill  . hydrocortisone cream 0.5 % Apply topically 2 (two) times daily.  30 g  0  . ibuprofen (ADVIL,MOTRIN) 200 MG tablet Take 800 mg by mouth every 3 (three) hours as needed for pain.       . cyclobenzaprine (FLEXERIL) 10 MG tablet Take 1 tablet (10 mg total) by mouth 3 (three) times daily as needed for muscle spasms.  30 tablet  0  . traMADol (ULTRAM) 50 MG tablet Take 1 tablet (50 mg total) by mouth every 8 (eight) hours as needed.  30 tablet  0   No current facility-administered medications for this visit.    SURGICAL HISTORY:  Past Surgical History  Procedure Laterality Date  . Hiatal hernia repair    . Tubal ligation    . 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 Resp: clear to auscultation bilaterally Back: symmetric, no curvature. ROM normal. No CVA tenderness. Cardio: regular rate and rhythm, S1, S2 normal, no murmur, click, rub or gallop GI: soft, non-tender; bowel sounds normal; no masses,  no organomegaly Extremities: extremities normal, atraumatic, no cyanosis or edema  ECOG PERFORMANCE STATUS: 1 - Symptomatic but completely ambulatory  Blood pressure 153/87, pulse 77, temperature 97.8 F (36.6 C), temperature source Oral, resp. rate 18, height '5\' 5"'  (1.651 m), weight 170 lb 4.8 oz (77.248 kg), SpO2 99.00%.  LABORATORY DATA: Lab Results  Component Value Date   WBC 17.3* 11/26/2013   HGB 12.8 11/26/2013   HCT 39.4 11/26/2013   MCV 91.4 11/26/2013   PLT 514* 11/26/2013      Chemistry      Component Value Date/Time   NA 142 11/26/2013 1135   NA 142 11/20/2013 1036   K 3.8 11/26/2013 1135   K 4.2 11/20/2013 1036   CL 107 11/20/2013 1036   CO2 25 11/26/2013 1135   CO2 24 11/20/2013 1036   BUN 5.5* 11/26/2013 1135   BUN 9 11/20/2013 1036   CREATININE 0.7 11/26/2013 1135   CREATININE  0.6 11/20/2013 1036      Component Value Date/Time   CALCIUM 9.8 11/26/2013 1135   CALCIUM 9.3 11/20/2013 1036   ALKPHOS 94 11/26/2013 1135   ALKPHOS 76 11/20/2013 1036   AST 15 11/26/2013 1135   AST 14 11/20/2013 1036   ALT 11 11/26/2013 1135   ALT 9 11/20/2013 1036   BILITOT 0.46 11/26/2013 1135   BILITOT 0.5 11/20/2013 1036       RADIOGRAPHIC STUDIES: No results found.  ASSESSMENT AND PLAN: This is a very pleasant 43 years old white female with persistent leukocytosis most likely  reactive in nature in the absence of any molecular abnormalities like BCR/ABL or JAK-2 mutation. I discussed the lab result with the patient today. I recommended for her to continue on observation with routine followup visit with her primary care physician. I would be happy to see her in the future if she continues to have further increase in the total white blood count or any significant lymphadenopathy. I would consider the patient for a bone marrow biopsy and aspirate at that time if needed. The patient and her mother agreed to the current plan. The patient voices understanding of current disease status and treatment options and is in agreement with the current care plan.  All questions were answered. The patient knows to call the clinic with any problems, questions or concerns. We can certainly see the patient much sooner if necessary.  Disclaimer: This note was dictated with voice recognition software. Similar sounding words can inadvertently be transcribed and may not be corrected upon review.

## 2013-12-17 NOTE — Telephone Encounter (Signed)
Relevant patient education assigned to patient using Emmi. ° °

## 2013-12-17 NOTE — Telephone Encounter (Signed)
Pt is calling in regards to her rxtraMADol (ULTRAM) 50 MG tablet, pt states the medicine is not working and her pain is the same, also pt is requesting to speak with the nurse regarding the referral to [facility], pt is stating that the doctor stating to her that she needs to be monitored on a regular and she needs verification if the monitoring should be done by her primary doctor.

## 2013-12-17 NOTE — Telephone Encounter (Signed)
Advised pt to call Hematology with questions about the monitoring. Also advised pt to call pain clinic to get an appointment

## 2013-12-19 ENCOUNTER — Telehealth: Payer: Self-pay | Admitting: *Deleted

## 2013-12-19 NOTE — Telephone Encounter (Signed)
Pt called with questions regarding her last visit with Dr Vista Mink.  Questions answered.  SLJ

## 2014-01-02 ENCOUNTER — Encounter: Payer: Self-pay | Admitting: Physical Medicine & Rehabilitation

## 2014-01-15 DIAGNOSIS — M797 Fibromyalgia: Secondary | ICD-10-CM

## 2014-01-15 HISTORY — DX: Fibromyalgia: M79.7

## 2014-01-21 ENCOUNTER — Telehealth: Payer: Self-pay | Admitting: *Deleted

## 2014-01-21 NOTE — Telephone Encounter (Signed)
Pt called with questions concerns regarding seeing her PCP for routine f/u appts.  Addressed questions and concerns.  SLJ

## 2014-02-11 ENCOUNTER — Encounter: Payer: Self-pay | Attending: Physical Medicine & Rehabilitation

## 2014-02-11 ENCOUNTER — Encounter: Payer: Self-pay | Admitting: Physical Medicine & Rehabilitation

## 2014-02-11 ENCOUNTER — Ambulatory Visit (HOSPITAL_BASED_OUTPATIENT_CLINIC_OR_DEPARTMENT_OTHER): Payer: Self-pay | Admitting: Physical Medicine & Rehabilitation

## 2014-02-11 VITALS — BP 152/81 | HR 91 | Resp 14 | Ht 65.0 in | Wt 172.6 lb

## 2014-02-11 DIAGNOSIS — M545 Low back pain, unspecified: Secondary | ICD-10-CM

## 2014-02-11 DIAGNOSIS — M542 Cervicalgia: Secondary | ICD-10-CM | POA: Insufficient documentation

## 2014-02-11 DIAGNOSIS — M79609 Pain in unspecified limb: Secondary | ICD-10-CM | POA: Insufficient documentation

## 2014-02-11 DIAGNOSIS — F17200 Nicotine dependence, unspecified, uncomplicated: Secondary | ICD-10-CM | POA: Insufficient documentation

## 2014-02-11 DIAGNOSIS — G8929 Other chronic pain: Secondary | ICD-10-CM

## 2014-02-11 DIAGNOSIS — Z5181 Encounter for therapeutic drug level monitoring: Secondary | ICD-10-CM | POA: Insufficient documentation

## 2014-02-11 DIAGNOSIS — Z79899 Other long term (current) drug therapy: Secondary | ICD-10-CM | POA: Insufficient documentation

## 2014-02-11 DIAGNOSIS — M797 Fibromyalgia: Secondary | ICD-10-CM | POA: Insufficient documentation

## 2014-02-11 DIAGNOSIS — R195 Other fecal abnormalities: Secondary | ICD-10-CM | POA: Insufficient documentation

## 2014-02-11 DIAGNOSIS — IMO0001 Reserved for inherently not codable concepts without codable children: Secondary | ICD-10-CM

## 2014-02-11 MED ORDER — GABAPENTIN 300 MG PO CAPS: 300.0000 mg | ORAL_CAPSULE | Freq: Three times a day (TID) | ORAL | Status: DC
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
## 2014-02-11 NOTE — Patient Instructions (Signed)

## 2014-02-19 ENCOUNTER — Telehealth: Payer: Self-pay

## 2014-02-19 NOTE — Telephone Encounter (Signed)
Urine drug screen from 02/11/2014 was inconsistent.  Per patient she was not on any medications.  UDS showed valium, hydrocodone, and oxycodone.  No history in chart of these medications.

## 2014-02-20 NOTE — Telephone Encounter (Signed)
Per office note 02/11/14 Dr Letta Pate discussed medication management and specifically that narcotic analgesics are not used in this situation. Unless she calls specifically asking for narcs it appears she was informed.

## 2014-02-20 NOTE — Telephone Encounter (Signed)
Call to inform of non narcotic treatment only

## 2014-03-11 ENCOUNTER — Emergency Department (HOSPITAL_COMMUNITY)
Admission: EM | Admit: 2014-03-11 | Discharge: 2014-03-11 | Disposition: A | Discharge status: Home / Self Care | Payer: Self-pay | Attending: Emergency Medicine | Admitting: Emergency Medicine

## 2014-03-11 ENCOUNTER — Encounter (HOSPITAL_COMMUNITY): Payer: Self-pay | Admitting: Emergency Medicine

## 2014-03-11 DIAGNOSIS — Z79899 Other long term (current) drug therapy: Secondary | ICD-10-CM | POA: Insufficient documentation

## 2014-03-11 DIAGNOSIS — IMO0002 Reserved for concepts with insufficient information to code with codable children: Secondary | ICD-10-CM | POA: Insufficient documentation

## 2014-03-11 DIAGNOSIS — F17200 Nicotine dependence, unspecified, uncomplicated: Secondary | ICD-10-CM | POA: Insufficient documentation

## 2014-03-11 DIAGNOSIS — G8929 Other chronic pain: Secondary | ICD-10-CM | POA: Insufficient documentation

## 2014-03-11 DIAGNOSIS — H9209 Otalgia, unspecified ear: Secondary | ICD-10-CM | POA: Insufficient documentation

## 2014-03-11 DIAGNOSIS — IMO0001 Reserved for inherently not codable concepts without codable children: Secondary | ICD-10-CM | POA: Insufficient documentation

## 2014-03-11 DIAGNOSIS — J029 Acute pharyngitis, unspecified: Secondary | ICD-10-CM | POA: Insufficient documentation

## 2014-03-11 DIAGNOSIS — R112 Nausea with vomiting, unspecified: Secondary | ICD-10-CM | POA: Insufficient documentation

## 2014-03-11 HISTORY — DX: Fibromyalgia: M79.7

## 2014-03-11 LAB — RAPID STREP SCREEN (MED CTR MEBANE ONLY): Streptococcus, Group A Screen (Direct): NEGATIVE

## 2014-03-11 MED ORDER — HYDROCODONE-ACETAMINOPHEN 7.5-325 MG/15ML PO SOLN
10.0000 mL | Freq: Once | ORAL | Status: AC
  Administered 2014-03-11: 10 mL via ORAL
  Filled 2014-03-11: qty 15

## 2014-03-11 MED ORDER — HYDROCODONE-ACETAMINOPHEN 7.5-325 MG/15ML PO SOLN: 15.0000 mL | Freq: Four times a day (QID) | ORAL | Status: AC | PRN

## 2014-03-11 MED ORDER — AMOXICILLIN 500 MG PO CAPS: 500.0000 mg | ORAL_CAPSULE | Freq: Three times a day (TID) | ORAL | Status: DC

## 2014-03-11 NOTE — ED Notes (Signed)
C/o sore throat and bilateral ear pain. States vomited 2 days ago. Throat swabbed for strep from Triage.

## 2014-03-11 NOTE — Discharge Instructions (Signed)
Continue salt water gargles. Use chloraseptic throat spray. Drink plenty of fluids. Stop smoking. This is most likely viral. If not improving in 3-4 days, you can take antibiotics given. Follow up with primary care doctor.    Pharyngitis Pharyngitis is redness, pain, and swelling (inflammation) of your pharynx.  CAUSES  Pharyngitis is usually caused by infection. Most of the time, these infections are from viruses (viral) and are part of a cold. However, sometimes pharyngitis is caused by bacteria (bacterial). Pharyngitis can also be caused by allergies. Viral pharyngitis may be spread from person to person by coughing, sneezing, and personal items or utensils (cups, forks, spoons, toothbrushes). Bacterial pharyngitis may be spread from person to person by more intimate contact, such as kissing.  SIGNS AND SYMPTOMS  Symptoms of pharyngitis include:   Sore throat.   Tiredness (fatigue).   Low-grade fever.   Headache.  Joint pain and muscle aches.  Skin rashes.  Swollen lymph nodes.  Plaque-like film on throat or tonsils (often seen with bacterial pharyngitis). DIAGNOSIS  Your health care provider will ask you questions about your illness and your symptoms. Your medical history, along with a physical exam, is often all that is needed to diagnose pharyngitis. Sometimes, a rapid strep test is done. Other lab tests may also be done, depending on the suspected cause.  TREATMENT  Viral pharyngitis will usually get better in 3 4 days without the use of medicine. Bacterial pharyngitis is treated with medicines that kill germs (antibiotics).  HOME CARE INSTRUCTIONS   Drink enough water and fluids to keep your urine clear or pale yellow.   Only take over-the-counter or prescription medicines as directed by your health care provider:   If you are prescribed antibiotics, make sure you finish them even if you start to feel better.   Do not take aspirin.   Get lots of rest.    Gargle with 8 oz of salt water ( tsp of salt per 1 qt of water) as often as every 1 2 hours to soothe your throat.   Throat lozenges (if you are not at risk for choking) or sprays may be used to soothe your throat. SEEK MEDICAL CARE IF:   You have large, tender lumps in your neck.  You have a rash.  You cough up green, yellow-brown, or bloody spit. SEEK IMMEDIATE MEDICAL CARE IF:   Your neck becomes stiff.  You drool or are unable to swallow liquids.  You vomit or are unable to keep medicines or liquids down.  You have severe pain that does not go away with the use of recommended medicines.  You have trouble breathing (not caused by a stuffy nose). MAKE SURE YOU:   Understand these instructions.  Will watch your condition.  Will get help right away if you are not doing well or get worse. Document Released: 10/03/2005 Document Revised: 07/24/2013 Document Reviewed: 06/10/2013 [facility] Patient Information 2014 [place].  Antibiotic Nonuse  Your caregiver felt that the infection or problem was not one that would be helped with an antibiotic. Infections may be caused by viruses or bacteria. Only a caregiver can tell which one of these is the likely cause of an illness. A cold is the most common cause of infection in both adults and children. A cold is a virus. Antibiotic treatment will have no effect on a viral infection. Viruses can lead to many lost days of work caring for sick children and many missed days of school. Children may catch as  many as 10 "colds" or "flus" per year during which they can be tearful, cranky, and uncomfortable. The goal of treating a virus is aimed at keeping the ill person comfortable. Antibiotics are medications used to help the body fight bacterial infections. There are relatively few types of bacteria that cause infections but there are hundreds of viruses. While both viruses and bacteria cause infection they are very different types of  germs. A viral infection will typically go away by itself within 7 to 10 days. Bacterial infections may spread or get worse without antibiotic treatment. Examples of bacterial infections are:  Sore throats (like strep throat or tonsillitis).  Infection in the lung (pneumonia).  Ear and skin infections. Examples of viral infections are:  Colds or flus.  Most coughs and bronchitis.  Sore throats not caused by Strep.  Runny noses. It is often best not to take an antibiotic when a viral infection is the cause of the problem. Antibiotics can kill off the helpful bacteria that we have inside our body and allow harmful bacteria to start growing. Antibiotics can cause side effects such as allergies, nausea, and diarrhea without helping to improve the symptoms of the viral infection. Additionally, repeated uses of antibiotics can cause bacteria inside of our body to become resistant. That resistance can be passed onto harmful bacterial. The next time you have an infection it may be harder to treat if antibiotics are used when they are not needed. Not treating with antibiotics allows our own immune system to develop and take care of infections more efficiently. Also, antibiotics will work better for [place] when they are prescribed for bacterial infections. Treatments for a child that is ill may include:  Give extra fluids throughout the day to stay hydrated.  Get plenty of rest.  Only give your child over-the-counter or prescription medicines for pain, discomfort, or fever as directed by your caregiver.  The use of a cool mist humidifier may help stuffy noses.  Cold medications if suggested by your caregiver. Your caregiver may decide to start you on an antibiotic if:  The problem you were seen for today continues for a longer length of time than expected.  You develop a secondary bacterial infection. SEEK MEDICAL CARE IF:  Fever lasts longer than 5 days.  Symptoms continue to get worse after  5 to 7 days or become severe.  Difficulty in breathing develops.  Signs of dehydration develop (poor drinking, rare urinating, dark colored urine).  Changes in behavior or worsening tiredness (listlessness or lethargy). Document Released: 12/12/2001 Document Revised: 12/26/2011 Document Reviewed: 06/10/2009 [facility] Patient Information 2014 [place], [place].

## 2014-03-11 NOTE — ED Provider Notes (Signed)
CSN: [id number]     Arrival date & time 03/11/14  1234 History  This chart was scribed for non-physician practitioner, Jeannett Senior, PA-C working with Merryl Hacker, MD by Frederich Balding, ED scribe. This patient was seen in room TR04C/TR04C and the patient's care was started at 2:59 PM.    Chief Complaint  Patient presents with  . Sore Throat    The patient said she has been having sore throat for a couple of months.  She said it has gotten so bad she can barely eat and she has been nauseous and vomitted on Sunday.     The history is provided by the patient. No language interpreter was used.   HPI Comments: Kelsey West is a 43 y.o. female who presents to the Emergency Department complaining of gradual onset sore throat that started 3 days ago. States has hot some soreness in her throat for several months, but got significantly worse. States the pain radiates into her ears. Pt has also had nausea and emesis. Her last episode of emesis was 2 days ago. She has taken ibuprofen and used throat lozenges and spray with no relief. Denies fever. Denies any nasal congestion. Pain is worsened with swallowing. Nothing makes it better. No difficulty swallowing  Past Medical History  Diagnosis Date  . Chronic back pain   . Fibromyalgia 01/2014   Past Surgical History  Procedure Laterality Date  . Hiatal hernia repair    . Tubal ligation    . 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 Appendectomy    . Hernia repair     Family History  Problem Relation Age of Onset  . Thyroid disease     History  Substance Use Topics  . Smoking status: Current Every Day Smoker -- 0.50 packs/day    Types: Cigarettes  . Smokeless tobacco: Not on file  . Alcohol Use: Yes   OB History   Grav Para Term Preterm Abortions TAB SAB Ect Mult Living                 Review of Systems  Constitutional: Negative for fever.  HENT: Positive for ear pain and sore throat.   Gastrointestinal: Positive  for nausea and vomiting.  All other systems reviewed and are negative.  Allergies  Review of patient's allergies indicates no known allergies.  Home Medications   Prior to Admission medications   Medication Sig Start Date End Date Taking? Authorizing Provider  cyclobenzaprine (FLEXERIL) 10 MG tablet Take 1 tablet (10 mg total) by mouth 3 (three) times daily as needed for muscle spasms. 11/20/13   Timoteo Gaul, FNP  gabapentin (NEURONTIN) 300 MG capsule Take 1 capsule (300 mg total) by mouth 3 (three) times daily. 02/11/14   Charlett Blake, MD  hydrocortisone cream 0.5 % Apply topically 2 (two) times daily. 06/10/13   Angelica Chessman, MD  ibuprofen (ADVIL,MOTRIN) 200 MG tablet Take 800 mg by mouth every 3 (three) hours as needed for pain.     Historical Provider, MD   BP 161/94  Pulse 106  Temp(Src) 98.7 F (37.1 C) (Oral)  Resp 18  Wt 167 lb (75.751 kg)  SpO2 97%  LMP 02/14/2014  Physical Exam  Nursing note and vitals reviewed. Constitutional: She is oriented to person, place, and time. She appears well-developed and well-nourished. No distress.  HENT:  Head: Normocephalic and atraumatic.  Right Ear: Tympanic membrane and ear canal normal.  Left Ear: Tympanic  membrane and ear canal normal.  Mouth/Throat: Uvula is midline. Posterior oropharyngeal erythema present. No oropharyngeal exudate or posterior oropharyngeal edema.  Eyes: EOM are normal.  Neck: Neck supple. No tracheal deviation present.  Cardiovascular: Normal rate, regular rhythm and normal heart sounds.   Pulmonary/Chest: Effort normal and breath sounds normal. No respiratory distress. She has no decreased breath sounds. She has no wheezes. She has no rhonchi. She has no rales.  Lungs clear bilaterally.   Musculoskeletal: Normal range of motion.  Lymphadenopathy:    She has cervical adenopathy.  Anterior cervical lymphadenopathy that is tender.  Neurological: She is alert and oriented to person, place, and  time.  Skin: Skin is warm and dry.  Psychiatric: She has a normal mood and affect. Her behavior is normal.    ED Course  Procedures (including critical care time)  DIAGNOSTIC STUDIES: Oxygen Saturation is 97% on RA, normal by my interpretation.    COORDINATION OF CARE: 3:01 PM-Discussed treatment plan which includes pain management and salt water gargles with pt at bedside and pt agreed to plan.   Labs Review Labs Reviewed  RAPID STREP SCREEN  CULTURE, GROUP A STREP    Imaging Review No results found.   EKG Interpretation None      MDM   Final diagnoses:  Pharyngitis    A shunt with a sore throat for a few months that is worse in the last few days. On exam no evidence of peritonsillar or retropharyngeal abscess. Patient is not having any respiratory problems. No difficulty swallowing. She's afebrile. She is requesting a biotics. Discussed with her that her symptoms are most likely [place] all. Will treat symptomatically. Patient requesting pain medication. Lortab given in ED and a prescription for home for pain. Continue salt water gargles. I did give her prescription for amoxicillin, however told not to take it for3-4ays unless he is not improving. Strep screen negative, culture sent.  Filed Vitals:   03/11/14 1241 03/11/14 1515  BP: 161/94 159/90  Pulse: 106 96  Temp: 98.7 F (37.1 C)   TempSrc: Oral   Resp: 18 18  Weight: 167 lb (75.751 kg)   SpO2: 97% 99%     I personally performed the services described in this documentation, which was scribed in my presence. The recorded information has been reviewed and is accurate.  Renold Genta, PA-C 03/11/14 1638

## 2014-03-11 NOTE — ED Notes (Signed)
The patient said she has been having sore throat for a couple of months it started around Easter..  She said it has gotten so bad she can barely eat and she has been nauseous and vomitted on Sunday.  Patient has not vomited since Sunday but she feels nauseous today.  Patient denies fever or any other symptoms.

## 2014-03-11 NOTE — ED Provider Notes (Signed)
Medical screening examination/treatment/procedure(s) were performed by non-physician practitioner and as supervising physician I was immediately available for consultation/collaboration.   EKG Interpretation None       Merryl Hacker, MD 03/11/14 [phone]

## 2014-03-13 LAB — CULTURE, GROUP A STREP

## 2014-04-23 ENCOUNTER — Telehealth: Payer: Self-pay

## 2014-04-23 NOTE — Telephone Encounter (Signed)
Patient states she is still having a lot of pain down her leg and in her foot with Gabapentin 300 mg TID. Patient is wanting to know if the medication dosage needs to be changed. Please advise.

## 2014-04-28 MED ORDER — GABAPENTIN 300 MG PO CAPS: 300.0000 mg | ORAL_CAPSULE | Freq: Four times a day (QID) | ORAL | Status: DC

## 2014-04-28 NOTE — Telephone Encounter (Signed)
May increase to QID

## 2014-04-28 NOTE — Telephone Encounter (Signed)
Patient informed of increase to gabapentin.  Refill sent to [facility].

## 2014-05-13 ENCOUNTER — Ambulatory Visit: Payer: Self-pay | Admitting: Physical Medicine & Rehabilitation

## 2014-06-16 ENCOUNTER — Ambulatory Visit: Payer: Self-pay | Admitting: Physical Medicine & Rehabilitation

## 2014-06-23 ENCOUNTER — Emergency Department (HOSPITAL_COMMUNITY)
Admission: EM | Admit: 2014-06-23 | Discharge: 2014-06-23 | Discharge status: Against Medical Advice | Payer: Self-pay | Attending: Emergency Medicine | Admitting: Emergency Medicine

## 2014-06-23 ENCOUNTER — Encounter (HOSPITAL_COMMUNITY): Payer: Self-pay | Admitting: Emergency Medicine

## 2014-06-23 DIAGNOSIS — R6883 Chills (without fever): Secondary | ICD-10-CM | POA: Insufficient documentation

## 2014-06-23 DIAGNOSIS — G8929 Other chronic pain: Secondary | ICD-10-CM | POA: Insufficient documentation

## 2014-06-23 DIAGNOSIS — F17200 Nicotine dependence, unspecified, uncomplicated: Secondary | ICD-10-CM | POA: Insufficient documentation

## 2014-06-23 DIAGNOSIS — R61 Generalized hyperhidrosis: Secondary | ICD-10-CM | POA: Insufficient documentation

## 2014-06-23 DIAGNOSIS — R1110 Vomiting, unspecified: Secondary | ICD-10-CM | POA: Insufficient documentation

## 2014-06-23 DIAGNOSIS — H538 Other visual disturbances: Secondary | ICD-10-CM | POA: Insufficient documentation

## 2014-06-23 DIAGNOSIS — J029 Acute pharyngitis, unspecified: Secondary | ICD-10-CM | POA: Insufficient documentation

## 2014-06-23 DIAGNOSIS — R51 Headache: Secondary | ICD-10-CM | POA: Insufficient documentation

## 2014-06-23 HISTORY — DX: Unspecified osteoarthritis, unspecified site: M19.90

## 2014-06-23 NOTE — ED Notes (Addendum)
Pt left. 

## 2014-06-23 NOTE — ED Notes (Signed)
Initial contact-c/o sore throat. Has had sore throat "off and on" since April. Was tested for strep and mono-both negative. Seen at cancer center in February and per MD- wanted patient to have WBC tested consistently to monitor for CML. Last WBC checked in February was 17,000. MD wanted patient to contact him if lymph nodes were swollen. Still c/o back pain and generalized pain. Audible congestion starting today. Took ibuprofen this morning. No other medications taken today. Unsure if patient has had a fever at home but c/o chills, diaphoresis. Denies N, V currently. Endorses emesis x1 last week and severe HA last week. Still c/o headache with slight blurry vision today. Denies SOB, chest pain. Had diarrhea this morning. No other complaints/concerns. A&Ox4. Ambulatory and speaking full/clear sentences. Awaiting MD/PA.

## 2014-06-24 ENCOUNTER — Telehealth: Payer: Self-pay | Admitting: Family

## 2014-06-24 NOTE — Telephone Encounter (Signed)
What is Dr. Worthy Flank specialty. Where is he located? What does the diagnosis need to be for the referral?

## 2014-06-24 NOTE — Telephone Encounter (Addendum)
Pt states she has had several issues w/ sore throat (for 3 months) . Went to ED and UC both, was put on abx, pt still not better. Pt  states she saw dr Julien Nordmann in feb/march and he test her for CML. It was neg, but Dr Julien Nordmann states he thinks something may be going on w/ her. Advised pt to fu if things did not get better. Pt's blood count was very high  Back in Feb. Pt would like to go and see him again, but states she needs referral.  Does pt needs to see you first, or can you refer pt to him now? Pt has no insurance. pls advise

## 2014-06-25 NOTE — Telephone Encounter (Signed)
Lm on VM to cb. °

## 2014-06-25 NOTE — Telephone Encounter (Addendum)
Kelsey West is at the cancer center. He is a cancer MD. Pt referred by padonda in feb. Dx poss Leukocytosis

## 2014-06-25 NOTE — Telephone Encounter (Signed)
Per Abby Potash, schedule OV

## 2014-06-26 NOTE — Telephone Encounter (Signed)
Pt has been sch

## 2014-06-30 ENCOUNTER — Ambulatory Visit: Payer: Self-pay | Admitting: Physical Medicine & Rehabilitation

## 2014-06-30 ENCOUNTER — Encounter: Payer: Self-pay | Attending: Physical Medicine & Rehabilitation

## 2014-07-02 ENCOUNTER — Ambulatory Visit (INDEPENDENT_AMBULATORY_CARE_PROVIDER_SITE_OTHER): Payer: Self-pay | Admitting: Family

## 2014-07-02 ENCOUNTER — Encounter: Payer: Self-pay | Admitting: Family

## 2014-07-02 VITALS — BP 140/90 | HR 102 | Temp 97.9°F | Wt 165.2 lb

## 2014-07-02 DIAGNOSIS — Z720 Tobacco use: Secondary | ICD-10-CM

## 2014-07-02 DIAGNOSIS — J312 Chronic pharyngitis: Secondary | ICD-10-CM

## 2014-07-02 DIAGNOSIS — D72829 Elevated white blood cell count, unspecified: Secondary | ICD-10-CM

## 2014-07-02 DIAGNOSIS — F17200 Nicotine dependence, unspecified, uncomplicated: Secondary | ICD-10-CM

## 2014-07-02 LAB — CBC WITH DIFFERENTIAL/PLATELET
BASOS PCT: 0.5 % (ref 0.0–3.0)
Basophils Absolute: 0.1 10*3/uL (ref 0.0–0.1)
EOS PCT: 1 % (ref 0.0–5.0)
Eosinophils Absolute: 0.2 10*3/uL (ref 0.0–0.7)
HCT: 37.5 % (ref 36.0–46.0)
Hemoglobin: 12.1 g/dL (ref 12.0–15.0)
LYMPHS PCT: 17.7 % (ref 12.0–46.0)
Lymphs Abs: 2.6 10*3/uL (ref 0.7–4.0)
MCHC: 32.2 g/dL (ref 30.0–36.0)
MCV: 88.1 fl (ref 78.0–100.0)
MONO ABS: 1 10*3/uL (ref 0.1–1.0)
MONOS PCT: 6.8 % (ref 3.0–12.0)
NEUTROS PCT: 74 % (ref 43.0–77.0)
Neutro Abs: 11 10*3/uL — ABNORMAL HIGH (ref 1.4–7.7)
PLATELETS: 542 10*3/uL — AB (ref 150.0–400.0)
RBC: 4.26 Mil/uL (ref 3.87–5.11)
RDW: 16.3 % — ABNORMAL HIGH (ref 11.5–15.5)
WBC: 14.9 10*3/uL — AB (ref 4.0–10.5)

## 2014-07-02 LAB — COMPREHENSIVE METABOLIC PANEL
ALBUMIN: 4.1 g/dL (ref 3.5–5.2)
ALK PHOS: 88 U/L (ref 39–117)
ALT: 11 U/L (ref 0–35)
AST: 18 U/L (ref 0–37)
BUN: 6 mg/dL (ref 6–23)
CALCIUM: 9.3 mg/dL (ref 8.4–10.5)
CHLORIDE: 100 meq/L (ref 96–112)
CO2: 28 meq/L (ref 19–32)
Creatinine, Ser: 0.6 mg/dL (ref 0.4–1.2)
GFR: 110.75 mL/min (ref >60.00)
GLUCOSE: 106 mg/dL — AB (ref 70–99)
POTASSIUM: 3.9 meq/L (ref 3.5–5.1)
SODIUM: 139 meq/L (ref 135–145)
TOTAL PROTEIN: 8.5 g/dL — AB (ref 6.0–8.3)
Total Bilirubin: 0.6 mg/dL (ref 0.2–1.2)

## 2014-07-02 MED ORDER — METHYLPREDNISOLONE 4 MG PO KIT: PACK | ORAL | Status: AC

## 2014-07-02 NOTE — Progress Notes (Signed)
Subjective:    Patient ID: Kelsey West, female    DOB: Mar 11, 1971, 43 y.o.   MRN: [id number]  HPI Comments: 43 year old white female, smoker, is in today with c/o persistent sore throat x 5 months. Has been seen at the ED, urgent care, and [facility]. She has been treated with antibiotic twice. Denies any symptom relief. Was also tested for mono. She saw oncology for leukocytosis earlier this year and was told to followup if symptoms worsen. She's requesting to go back to Dr. Earlie Server. Denies any heartburn or indigestion. However, she has a history of hiatal hernia. Had a [employer] fundoplication in 2671. Has not seen ENT nor gastroenterology.     Review of Systems  Constitutional: Negative.   HENT: Positive for sore throat.        Sorethroat x 5 months.   Respiratory: Negative.   Cardiovascular: Negative.   Gastrointestinal:       No heartburn or indigestion  Endocrine: Negative.   Genitourinary: Negative.   Musculoskeletal: Negative.   Skin: Negative.   Allergic/Immunologic: Negative.   Neurological: Negative.   Hematological: Negative.   Psychiatric/Behavioral: Negative.    Past Medical History  Diagnosis Date  . Chronic back pain   . Fibromyalgia 01/2014  . Arthritis     History   Social History  . Marital Status: Single    Spouse Name: N/A    Number of Children: 3  . Years of Education: 14   Occupational History  . Applied disability     Social History Main Topics  . Smoking status: Current Every Day Smoker -- 0.50 packs/day    Types: Cigarettes  . Smokeless tobacco: Not on file  . Alcohol Use: Yes  . Drug Use: No  . Sexual Activity: Not on file   Other Topics Concern  . Not on file   Social History Narrative  . No narrative on file    Past Surgical History  Procedure Laterality Date  . Hiatal hernia repair    . Tubal ligation    . 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 Appendectomy    . Hernia repair      Family History  Problem  Relation Age of Onset  . Thyroid disease      No Known Allergies  Current Outpatient Prescriptions on File Prior to Visit  Medication Sig Dispense Refill  . gabapentin (NEURONTIN) 300 MG capsule Take 1 capsule (300 mg total) by mouth 4 (four) times daily.  90 capsule  1  . HYDROcodone-acetaminophen (HYCET) 7.5-325 mg/15 ml solution Take 15 mLs by mouth 4 (four) times daily as needed for moderate pain.  120 mL  0  . hydrocortisone cream 0.5 % Apply topically 2 (two) times daily.  30 g  0  . ibuprofen (ADVIL,MOTRIN) 200 MG tablet Take 800 mg by mouth every 3 (three) hours as needed for pain.        No current facility-administered medications on file prior to visit.    BP 140/90  Pulse 102  Temp(Src) 97.9 F (36.6 C) (Oral)  Wt 165 lb 3.2 oz (74.934 kg)  LMP 08/12/2015chart    Objective:   Physical Exam  Constitutional: She is oriented to person, place, and time. She appears well-developed and well-nourished.  HENT:  Right Ear: External ear normal.  Left Ear: External ear normal.  Nose: Nose normal.  Mouth/Throat: Oropharynx is clear and moist.  Neck: Normal range of motion. Neck supple.  Cardiovascular: Normal rate, regular rhythm and normal heart sounds.   Pulmonary/Chest: Effort normal and breath sounds normal.  Musculoskeletal: Normal range of motion.  Neurological: She is alert and oriented to person, place, and time.  Skin: Skin is warm and dry.  Psychiatric: She has a normal mood and affect.          Assessment & Plan:  [name] was seen today for sore throat.  Diagnoses and associated orders for this visit:  Pharyngitis, chronic - HIV antibody (with reflex) - CBC with Differential - CMP - Ambulatory referral to ENT  Tobacco abuse - CBC with Differential - CMP  Other Orders - methylPREDNISolone (MEDROL DOSEPAK) 4 MG tablet; follow package directions    Call the office with any questions or concerns. Due to chronic sore throat, refer to ENT.  Consider referral to gastroenterology if no etiology is found to ENT. Medrol Dosepak to help decrease inflammation. Labs sent today we'll followup pending results.

## 2014-07-02 NOTE — Patient Instructions (Signed)

## 2014-07-02 NOTE — Progress Notes (Signed)
Pre visit review using our clinic review tool, if applicable. No additional management support is needed unless otherwise documented below in the visit note. 

## 2014-07-03 LAB — HIV ANTIBODY (ROUTINE TESTING W REFLEX): HIV: NONREACTIVE

## 2014-07-08 ENCOUNTER — Telehealth: Payer: Self-pay | Admitting: Family

## 2014-07-08 NOTE — Telephone Encounter (Signed)
Pt would like lab results.  

## 2014-07-08 NOTE — Telephone Encounter (Signed)
Pt aware of results 

## 2014-07-21 ENCOUNTER — Telehealth: Payer: Self-pay | Admitting: *Deleted

## 2014-07-21 NOTE — Telephone Encounter (Signed)
Pt called wanting to Dr Vista Mink to evaluate her CBC from 06/2014.  She has been having a sore throat since May, been on 2 different antibiotics, her armpits are sore and she has a tighness in her ribs at times.  She has seen her PCP and ENT and she wants to know if she needs to be evaluated by Dr Vista Mink again.  Informed given to Dr Vista Mink, per Dr Vista Mink, no f/u is necessary at this time, she needs to continue to f/u with PCP.  Pt verbalized understanding.

## 2014-08-01 ENCOUNTER — Other Ambulatory Visit: Payer: Self-pay

## 2015-01-09 ENCOUNTER — Emergency Department (HOSPITAL_COMMUNITY): Payer: Self-pay

## 2015-01-09 ENCOUNTER — Emergency Department (HOSPITAL_COMMUNITY)
Admission: EM | Admit: 2015-01-09 | Discharge: 2015-01-09 | Disposition: A | Discharge status: Home / Self Care | Payer: Self-pay | Attending: Emergency Medicine | Admitting: Emergency Medicine

## 2015-01-09 ENCOUNTER — Encounter (HOSPITAL_COMMUNITY): Payer: Self-pay | Admitting: Emergency Medicine

## 2015-01-09 DIAGNOSIS — J029 Acute pharyngitis, unspecified: Secondary | ICD-10-CM | POA: Insufficient documentation

## 2015-01-09 DIAGNOSIS — K449 Diaphragmatic hernia without obstruction or gangrene: Secondary | ICD-10-CM

## 2015-01-09 DIAGNOSIS — Z720 Tobacco use: Secondary | ICD-10-CM | POA: Insufficient documentation

## 2015-01-09 DIAGNOSIS — K439 Ventral hernia without obstruction or gangrene: Secondary | ICD-10-CM

## 2015-01-09 DIAGNOSIS — Z3202 Encounter for pregnancy test, result negative: Secondary | ICD-10-CM | POA: Insufficient documentation

## 2015-01-09 DIAGNOSIS — R1013 Epigastric pain: Secondary | ICD-10-CM

## 2015-01-09 DIAGNOSIS — G8929 Other chronic pain: Secondary | ICD-10-CM | POA: Insufficient documentation

## 2015-01-09 DIAGNOSIS — M797 Fibromyalgia: Secondary | ICD-10-CM | POA: Insufficient documentation

## 2015-01-09 DIAGNOSIS — R109 Unspecified abdominal pain: Secondary | ICD-10-CM

## 2015-01-09 LAB — CBC WITH DIFFERENTIAL/PLATELET
BASOS ABS: 0 10*3/uL (ref 0.0–0.1)
Basophils Relative: 0 % (ref 0–1)
EOS ABS: 0.2 10*3/uL (ref 0.0–0.7)
EOS PCT: 1 % (ref 0–5)
HEMATOCRIT: 37.1 % (ref 36.0–46.0)
HEMOGLOBIN: 11.2 g/dL — AB (ref 12.0–15.0)
Lymphocytes Relative: 16 % (ref 12–46)
Lymphs Abs: 2.6 10*3/uL (ref 0.7–4.0)
MCH: 26.1 pg (ref 26.0–34.0)
MCHC: 30.2 g/dL (ref 30.0–36.0)
MCV: 86.5 fL (ref 78.0–100.0)
MONOS PCT: 7 % (ref 3–12)
Monocytes Absolute: 1.2 10*3/uL — ABNORMAL HIGH (ref 0.1–1.0)
Neutro Abs: 12.1 10*3/uL — ABNORMAL HIGH (ref 1.7–7.7)
Neutrophils Relative %: 76 % (ref 43–77)
Platelets: 546 10*3/uL — ABNORMAL HIGH (ref 150–400)
RBC: 4.29 MIL/uL (ref 3.87–5.11)
RDW: 14.1 % (ref 11.5–15.5)
WBC: 16.1 10*3/uL — ABNORMAL HIGH (ref 4.0–10.5)

## 2015-01-09 LAB — LIPASE, BLOOD: LIPASE: 15 U/L (ref 11–59)

## 2015-01-09 LAB — COMPREHENSIVE METABOLIC PANEL
ALBUMIN: 4 g/dL (ref 3.5–5.2)
ALT: 9 U/L (ref 0–35)
AST: 17 U/L (ref 0–37)
Alkaline Phosphatase: 86 U/L (ref 39–117)
Anion gap: 11 (ref 5–15)
BUN: 6 mg/dL (ref 6–23)
CALCIUM: 9.4 mg/dL (ref 8.4–10.5)
CO2: 28 mmol/L (ref 19–32)
CREATININE: 0.55 mg/dL (ref 0.50–1.10)
Chloride: 102 mmol/L (ref 96–112)
GFR calc non Af Amer: >90 mL/min (ref >90)
GLUCOSE: 98 mg/dL (ref 70–99)
Potassium: 3.3 mmol/L — ABNORMAL LOW (ref 3.5–5.1)
Sodium: 141 mmol/L (ref 135–145)
TOTAL PROTEIN: 8.3 g/dL (ref 6.0–8.3)
Total Bilirubin: 0.5 mg/dL (ref 0.3–1.2)

## 2015-01-09 LAB — PREGNANCY, URINE: Preg Test, Ur: NEGATIVE

## 2015-01-09 MED ORDER — MORPHINE SULFATE 4 MG/ML IJ SOLN
4.0000 mg | INTRAMUSCULAR | Status: DC | PRN
  Administered 2015-01-09: 4 mg via INTRAVENOUS
  Filled 2015-01-09: qty 1

## 2015-01-09 MED ORDER — PANTOPRAZOLE SODIUM 40 MG IV SOLR
40.0000 mg | Freq: Once | INTRAVENOUS | Status: AC
  Administered 2015-01-09: 40 mg via INTRAVENOUS
  Filled 2015-01-09: qty 40

## 2015-01-09 MED ORDER — SODIUM CHLORIDE 0.9 % IV SOLN
Freq: Once | INTRAVENOUS | Status: AC
  Administered 2015-01-09: 12:00:00 via INTRAVENOUS

## 2015-01-09 MED ORDER — OMEPRAZOLE 20 MG PO CPDR: 20.0000 mg | DELAYED_RELEASE_CAPSULE | Freq: Two times a day (BID) | ORAL | Status: DC

## 2015-01-09 MED ORDER — IOHEXOL 300 MG/ML  SOLN
100.0000 mL | Freq: Once | INTRAMUSCULAR | Status: AC | PRN
  Administered 2015-01-09: 100 mL via INTRAVENOUS

## 2015-01-09 MED ORDER — IOHEXOL 300 MG/ML  SOLN
50.0000 mL | Freq: Once | INTRAMUSCULAR | Status: AC | PRN
  Administered 2015-01-09: 50 mL via ORAL

## 2015-01-09 MED ORDER — ONDANSETRON HCL 4 MG/2ML IJ SOLN
4.0000 mg | Freq: Once | INTRAMUSCULAR | Status: AC
  Administered 2015-01-09: 4 mg via INTRAVENOUS
  Filled 2015-01-09: qty 2

## 2015-01-09 NOTE — ED Notes (Addendum)
Pt from home c/o upper abdominal pain and feels a lump there. She also c/o a sore throat since May. She was had a endoscopy in September that was negative. Pt reports being worked up for high WBC but oncology was unable to determine a cause.

## 2015-01-09 NOTE — Discharge Instructions (Signed)
Hiatal Hernia A hiatal hernia occurs when part of your stomach slides above the muscle that separates your abdomen from your chest (diaphragm). You can be born with a hiatal hernia (congenital), or it may develop over time. In almost all cases of hiatal hernia, only the top part of the stomach pushes through.  Many people have a hiatal hernia with no symptoms. The larger the hernia, the more likely that you will have symptoms. In some cases, a hiatal hernia allows stomach acid to flow back into the tube that carries food from your mouth to your stomach (esophagus). This may cause heartburn symptoms. Severe heartburn symptoms may mean you have developed a condition called gastroesophageal reflux disease (GERD).  CAUSES  Hiatal hernias are caused by a weakness in the opening (hiatus) where your esophagus passes through your diaphragm to attach to the upper part of your stomach. You may be born with a weakness in your hiatus, or a weakness can develop. RISK FACTORS Older age is a major risk factor for a hiatal hernia. Anything that increases pressure on your diaphragm can also increase your risk of a hiatal hernia. This includes:  Pregnancy.  Excess weight.  Frequent constipation. SIGNS AND SYMPTOMS  People with a hiatal hernia often have no symptoms. If symptoms develop, they are almost always caused by GERD. They may include:  Heartburn.  Belching.  Indigestion.  Trouble swallowing.  Coughing or wheezing.  Sore throat.  Hoarseness.  Chest pain. DIAGNOSIS  A hiatal hernia is sometimes found during an exam for another problem. Your health care provider may suspect a hiatal hernia if you have symptoms of GERD. Tests may be done to diagnose GERD. These may include:  X-rays of your stomach or chest.  An upper gastrointestinal (GI) series. This is an X-ray exam of your GI tract involving the use of a chalky liquid that you swallow. The liquid shows up clearly on the X-ray.  Endoscopy.  This is a procedure to look into your stomach using a thin, flexible tube that has a tiny camera and light on the end of it. TREATMENT  If you have no symptoms, you may not need treatment. If you have symptoms, treatment may include:  Dietary and lifestyle changes to help reduce GERD symptoms.  Medicines. These may include:  Over-the-counter antacids.  Medicines that make your stomach empty more quickly.  Medicines that block the production of stomach acid (H2 blockers).  Stronger medicines to reduce stomach acid (proton pump inhibitors).  You may need surgery to repair the hernia if other treatments are not helping. HOME CARE INSTRUCTIONS   Take all medicines as directed by your health care provider.  Quit smoking, if you smoke.  Try to achieve and maintain a healthy body weight.  Eat frequent small meals instead of three large meals a day. This keeps your stomach from getting too full.  Eat slowly.  Do not lie down right after eating.  Do noteat 1-2 hours before bed.   Do not drink beverages with caffeine. These include cola, coffee, cocoa, and tea.  Do not drink alcohol.  Avoid foods that can make symptoms of GERD worse. These may include:  Fatty foods.  Citrus fruits.  Other foods and drinks that contain acid.  Avoid putting pressure on your belly. Anything that puts pressure on your belly increases the amount of acid that may be pushed up into your esophagus.   Avoid bending over, especially after eating.  Raise the head of your bed  by putting blocks under the legs. This keeps your head and esophagus higher than your stomach.  Do not wear tight clothing around your chest or stomach.  Try not to strain when having a bowel movement, when urinating, or when lifting heavy objects. SEEK MEDICAL CARE IF:  Your symptoms are not controlled with medicines or lifestyle changes.  You are having trouble swallowing.  You have coughing or wheezing that will not  go away. SEEK IMMEDIATE MEDICAL CARE IF:  Your pain is getting worse.  Your pain spreads to your arms, neck, jaw, teeth, or back.  You have shortness of breath.  You sweat for no reason.  You feel sick to your stomach (nauseous) or vomit.  You vomit blood.  You have bright red blood in your stools.  You have black, tarry stools.  Document Released: 12/24/2003 Document Revised: 02/17/2014 Document Reviewed: 09/20/2013 [facility] Patient Information 2015 [place], [place]. This information is not intended to replace advice given to you by your health care provider. Make sure you discuss any questions you have with your health care provider.  Gastroesophageal Reflux Disease, Adult Gastroesophageal reflux disease (GERD) happens when acid from your stomach flows up into the esophagus. When acid comes in contact with the esophagus, the acid causes soreness (inflammation) in the esophagus. Over time, GERD may create small holes (ulcers) in the lining of the esophagus. CAUSES   Increased body weight. This puts pressure on the stomach, making acid rise from the stomach into the esophagus.  Smoking. This increases acid production in the stomach.  Drinking alcohol. This causes decreased pressure in the lower esophageal sphincter (valve or ring of muscle between the esophagus and stomach), allowing acid from the stomach into the esophagus.  Late evening meals and a full stomach. This increases pressure and acid production in the stomach.  A malformed lower esophageal sphincter. Sometimes, no cause is found. SYMPTOMS   Burning pain in the lower part of the mid-chest behind the breastbone and in the mid-stomach area. This may occur twice a week or more often.  Trouble swallowing.  Sore throat.  Dry cough.  Asthma-like symptoms including chest tightness, shortness of breath, or wheezing. DIAGNOSIS  Your caregiver may be able to diagnose GERD based on your symptoms. In some cases, X-rays  and other tests may be done to check for complications or to check the condition of your stomach and esophagus. TREATMENT  Your caregiver may recommend over-the-counter or prescription medicines to help decrease acid production. Ask your caregiver before starting or adding any new medicines.  HOME CARE INSTRUCTIONS   Change the factors that you can control. Ask your caregiver for guidance concerning weight loss, quitting smoking, and alcohol consumption.  Avoid foods and drinks that make your symptoms worse, such as:  Caffeine or alcoholic drinks.  Chocolate.  Peppermint or mint flavorings.  Garlic and onions.  Spicy foods.  Citrus fruits, such as oranges, lemons, or limes.  Tomato-based foods such as sauce, chili, salsa, and pizza.  Fried and fatty foods.  Avoid lying down for the 3 hours prior to your bedtime or prior to taking a nap.  Eat small, frequent meals instead of large meals.  Wear loose-fitting clothing. Do not wear anything tight around your waist that causes pressure on your stomach.  Raise the head of your bed 6 to 8 inches with wood blocks to help you sleep. Extra pillows will not help.  Only take over-the-counter or prescription medicines for pain, discomfort, or fever as directed  by your caregiver.  Do not take aspirin, ibuprofen, or other nonsteroidal anti-inflammatory drugs (NSAIDs). SEEK IMMEDIATE MEDICAL CARE IF:   You have pain in your arms, neck, jaw, teeth, or back.  Your pain increases or changes in intensity or duration.  You develop nausea, vomiting, or sweating (diaphoresis).  You develop shortness of breath, or you faint.  Your vomit is green, yellow, black, or looks like coffee grounds or blood.  Your stool is red, bloody, or black. These symptoms could be signs of other problems, such as heart disease, gastric bleeding, or esophageal bleeding. MAKE SURE YOU:   Understand these instructions.  Will watch your condition.  Will get  help right away if you are not doing well or get worse. Document Released: 07/13/2005 Document Revised: 12/26/2011 Document Reviewed: 04/22/2011 [facility] Patient Information 2015 [place], [place]. This information is not intended to replace advice given to you by your health care provider. Make sure you discuss any questions you have with your health care provider.

## 2015-01-09 NOTE — ED Notes (Signed)
Patient comes from home with c/o mid-abdominal pain since December 2015.  Patient has not sought care for pain prior to today.  Patient endorses nausea several days ago, but currently denies N/V.  Patient denies diarrhea and fever.  Patient's abdomen is obese, but not distended.  Bowel sounds diminished.

## 2015-01-09 NOTE — ED Provider Notes (Signed)
CSN: [id number]     Arrival date & time 01/09/15  1051 History   First MD Initiated Contact with Patient 01/09/15 1111     Chief Complaint  Patient presents with  . Abdominal Pain  . Sore Throat     HPI  Patient presents evaluation of chronic cough since mid abdominal pain. History of hiatal hernia repair. History of persistent elevated leukocytosis. Has seen oncology and his had negative evaluation for leukemia. Has had follow-up in 6 months and states that it has remained elevated, but "nothing else". Cough primarily in the morning. Some burning in her epigastrium. States she feels "like being but" that she can palpate in her epigastrium and her upper abdomen.  Past Medical History  Diagnosis Date  . Chronic back pain   . Fibromyalgia 01/2014  . Arthritis    Past Surgical History  Procedure Laterality Date  . Hiatal hernia repair    . Tubal ligation    . 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 Appendectomy    . Hernia repair     Family History  Problem Relation Age of Onset  . Thyroid disease     History  Substance Use Topics  . Smoking status: Current Every Day Smoker -- 0.50 packs/day    Types: Cigarettes  . Smokeless tobacco: Not on file  . Alcohol Use: Yes   OB History    No data available     Review of Systems  Constitutional: Negative for fever, chills, diaphoresis, appetite change and fatigue.  HENT: Negative for mouth sores, sore throat and trouble swallowing.   Eyes: Negative for visual disturbance.  Respiratory: Positive for cough. Negative for chest tightness, shortness of breath and wheezing.   Cardiovascular: Negative for chest pain.  Gastrointestinal: Positive for abdominal pain. Negative for nausea, vomiting, diarrhea and abdominal distention.  Endocrine: Negative for polydipsia, polyphagia and polyuria.  Genitourinary: Negative for dysuria, frequency and hematuria.  Musculoskeletal: Negative for gait problem.  Skin: Negative for color  change, pallor and rash.  Neurological: Negative for dizziness, syncope, light-headedness and headaches.  Hematological: Does not bruise/bleed easily.  Psychiatric/Behavioral: Negative for behavioral problems and confusion.      Allergies  Review of patient's allergies indicates no known allergies.  Home Medications   Prior to Admission medications   Medication Sig Start Date End Date Taking? Authorizing Provider  ibuprofen (ADVIL,MOTRIN) 200 MG tablet Take 800 mg by mouth every 3 (three) hours as needed for pain.    Yes Historical Provider, MD  gabapentin (NEURONTIN) 300 MG capsule Take 1 capsule (300 mg total) by mouth 4 (four) times daily. Patient not taking: Reported on 01/09/2015 04/28/14   Charlett Blake, MD  HYDROcodone-acetaminophen (HYCET) 7.5-325 mg/15 ml solution Take 15 mLs by mouth 4 (four) times daily as needed for moderate pain. Patient not taking: Reported on 01/09/2015 03/11/14 03/11/15  Jeannett Senior, PA-C  hydrocortisone cream 0.5 % Apply topically 2 (two) times daily. Patient not taking: Reported on 01/09/2015 06/10/13   Tresa Garter, MD  omeprazole (PRILOSEC) 20 MG capsule Take 1 capsule (20 mg total) by mouth 2 (two) times daily. 01/09/15   Tanna Furry, MD   BP 183/97 mmHg  Pulse 84  Temp(Src) 97.7 F (36.5 C) (Oral)  Resp 17  SpO2 99%  LMP 12/29/2014 Physical Exam  Constitutional: She is oriented to person, place, and time. She appears well-developed and well-nourished. No distress.  HENT:  Head: Normocephalic.  Pharynx appears normal.  Eyes: Conjunctivae are normal. Pupils are equal, round, and reactive to light. No scleral icterus.  Neck: Normal range of motion. Neck supple. No thyromegaly present.  Adenopathy in the neck. Normal size thyroid.  Cardiovascular: Normal rate and regular rhythm.  Exam reveals no gallop and no friction rub.   No murmur heard. Pulmonary/Chest: Effort normal and breath sounds normal. No respiratory distress. She has  no wheezes. She has no rales.  Abdominal: Soft. Bowel sounds are normal. She exhibits no distension. There is no tenderness. There is no rebound.    Half-dollar size palpable nontender soft mass in the epigastrium. Not freely reducible. Seems to be mobile within the subcutaneous. Exam is consistent with lipoma.  Musculoskeletal: Normal range of motion.  Neurological: She is alert and oriented to person, place, and time.  Skin: Skin is warm and dry. No rash noted.  Psychiatric: She has a normal mood and affect. Her behavior is normal.    ED Course  Procedures (including critical care time) Labs Review Labs Reviewed  CBC WITH DIFFERENTIAL/PLATELET - Abnormal; Notable for the following:    WBC 16.1 (*)    Hemoglobin 11.2 (*)    Platelets 546 (*)    Neutro Abs 12.1 (*)    Monocytes Absolute 1.2 (*)    All other components within normal limits  COMPREHENSIVE METABOLIC PANEL - Abnormal; Notable for the following:    Potassium 3.3 (*)    All other components within normal limits  PREGNANCY, URINE  LIPASE, BLOOD    Imaging Review Ct Abdomen Pelvis W Contrast  01/09/2015   CLINICAL DATA:  Epigastric abdominal pain  EXAM: CT ABDOMEN AND PELVIS WITH CONTRAST  TECHNIQUE: Multidetector CT imaging of the abdomen and pelvis was performed using the standard protocol following bolus administration of intravenous contrast.  CONTRAST:  11mL OMNIPAQUE IOHEXOL 300 MG/ML SOLN, 153mL OMNIPAQUE IOHEXOL 300 MG/ML SOLN  COMPARISON:  Lumbar spine MRI 06/18/2013. No prior abdominal or pelvic cross-sectional imaging for comparison.  FINDINGS: Lower chest: Minimal dependent bibasilar atelectasis is present. Small hiatal hernia noted with epigastric clips in place.  Hepatobiliary: Probable focal fat medial segment left hepatic lobe image 29 adjacent to the falciform ligament. Liver is otherwise unremarkable. Gallbladder is normal.  Pancreas: Normal  Spleen: Normal  Adrenals/Urinary Tract: Left upper pole 2.3 cm  cortical cyst adjacent to nonobstructing 6 mm calculus identified image 29. No hydroureteronephrosis. Adrenal glands are normal. Right mid renal cortical 0.9 cm cyst identified image 34. Right lower renal pole cortical cyst measures 1.0 cm image 39.  Stomach/Bowel: Colonic diverticuli noted without evidence for diverticulitis. Small bowel and stomach are unremarkable aside from hiatal hernia described above.  Vascular/Lymphatic: Minimal atheromatous aortic calcifications without aneurysm. No lymphadenopathy.  Other: No free air. Uterus and ovaries appear normal. Midline ventral abdominal wall fat containing hernia identified image 36.  Musculoskeletal: No acute osseous abnormality.  IMPRESSION: No acute intra-abdominal or pelvic pathology.  Small hiatal hernia.   Electronically Signed   By: Conchita Paris M.D.   On: 01/09/2015 14:01     EKG Interpretation None      MDM   Final diagnoses:  Abdominal pain  Ventral hernia without obstruction or gangrene  Hiatal hernia    CT scan does show either fat-containing ventral hernia versus subcutaneous lipoma and epigastric area. Small I'll hernia. No other acute other maladies noted. In particular no concerning lymphadenopathy, or mass to suggest lymphoma with the patient's persistent leukocytosis. Her symptoms sound most consistent with a cough secondary  to chronic reflux esophagitis. Plan is replacing her on proton pump inhibitor. Primary care follow-up. Reflux information precautions discussed and given written form.    Tanna Furry, MD 01/09/15 [phone]

## 2015-02-22 ENCOUNTER — Encounter (HOSPITAL_COMMUNITY): Payer: Self-pay | Admitting: Emergency Medicine

## 2015-02-22 ENCOUNTER — Emergency Department (HOSPITAL_COMMUNITY): Payer: Self-pay

## 2015-02-22 ENCOUNTER — Emergency Department (HOSPITAL_COMMUNITY)
Admission: EM | Admit: 2015-02-22 | Discharge: 2015-02-22 | Disposition: A | Discharge status: Home / Self Care | Payer: Self-pay | Attending: Emergency Medicine | Admitting: Emergency Medicine

## 2015-02-22 DIAGNOSIS — K0889 Other specified disorders of teeth and supporting structures: Secondary | ICD-10-CM

## 2015-02-22 DIAGNOSIS — K088 Other specified disorders of teeth and supporting structures: Secondary | ICD-10-CM | POA: Insufficient documentation

## 2015-02-22 DIAGNOSIS — G8929 Other chronic pain: Secondary | ICD-10-CM | POA: Insufficient documentation

## 2015-02-22 DIAGNOSIS — L040 Acute lymphadenitis of face, head and neck: Secondary | ICD-10-CM | POA: Insufficient documentation

## 2015-02-22 DIAGNOSIS — Z79899 Other long term (current) drug therapy: Secondary | ICD-10-CM | POA: Insufficient documentation

## 2015-02-22 DIAGNOSIS — R1013 Epigastric pain: Secondary | ICD-10-CM | POA: Insufficient documentation

## 2015-02-22 DIAGNOSIS — M1990 Unspecified osteoarthritis, unspecified site: Secondary | ICD-10-CM | POA: Insufficient documentation

## 2015-02-22 DIAGNOSIS — J3489 Other specified disorders of nose and nasal sinuses: Secondary | ICD-10-CM | POA: Insufficient documentation

## 2015-02-22 DIAGNOSIS — Z9049 Acquired absence of other specified parts of digestive tract: Secondary | ICD-10-CM | POA: Insufficient documentation

## 2015-02-22 DIAGNOSIS — Z3202 Encounter for pregnancy test, result negative: Secondary | ICD-10-CM | POA: Insufficient documentation

## 2015-02-22 DIAGNOSIS — R112 Nausea with vomiting, unspecified: Secondary | ICD-10-CM | POA: Insufficient documentation

## 2015-02-22 DIAGNOSIS — Z9889 Other specified postprocedural states: Secondary | ICD-10-CM | POA: Insufficient documentation

## 2015-02-22 DIAGNOSIS — Z7952 Long term (current) use of systemic steroids: Secondary | ICD-10-CM | POA: Insufficient documentation

## 2015-02-22 DIAGNOSIS — R079 Chest pain, unspecified: Secondary | ICD-10-CM | POA: Insufficient documentation

## 2015-02-22 DIAGNOSIS — M797 Fibromyalgia: Secondary | ICD-10-CM | POA: Insufficient documentation

## 2015-02-22 DIAGNOSIS — Z9851 Tubal ligation status: Secondary | ICD-10-CM | POA: Insufficient documentation

## 2015-02-22 DIAGNOSIS — Z791 Long term (current) use of non-steroidal anti-inflammatories (NSAID): Secondary | ICD-10-CM | POA: Insufficient documentation

## 2015-02-22 DIAGNOSIS — I889 Nonspecific lymphadenitis, unspecified: Secondary | ICD-10-CM

## 2015-02-22 LAB — CBC WITH DIFFERENTIAL/PLATELET
Basophils Absolute: 0 10*3/uL (ref 0.0–0.1)
Basophils Relative: 0 % (ref 0–1)
Eosinophils Absolute: 0 10*3/uL (ref 0.0–0.7)
Eosinophils Relative: 0 % (ref 0–5)
HEMATOCRIT: 35 % — AB (ref 36.0–46.0)
HEMOGLOBIN: 10.8 g/dL — AB (ref 12.0–15.0)
LYMPHS ABS: 1.3 10*3/uL (ref 0.7–4.0)
LYMPHS PCT: 7 % — AB (ref 12–46)
MCH: 26.3 pg (ref 26.0–34.0)
MCHC: 30.9 g/dL (ref 30.0–36.0)
MCV: 85.4 fL (ref 78.0–100.0)
MONOS PCT: 5 % (ref 3–12)
Monocytes Absolute: 1 10*3/uL (ref 0.1–1.0)
Neutro Abs: 16.4 10*3/uL — ABNORMAL HIGH (ref 1.7–7.7)
Neutrophils Relative %: 88 % — ABNORMAL HIGH (ref 43–77)
Platelets: 629 10*3/uL — ABNORMAL HIGH (ref 150–400)
RBC: 4.1 MIL/uL (ref 3.87–5.11)
RDW: 14.6 % (ref 11.5–15.5)
WBC: 18.8 10*3/uL — AB (ref 4.0–10.5)

## 2015-02-22 LAB — LIPASE, BLOOD: LIPASE: 16 U/L — AB (ref 22–51)

## 2015-02-22 LAB — COMPREHENSIVE METABOLIC PANEL
ALT: 9 U/L — ABNORMAL LOW (ref 14–54)
AST: 17 U/L (ref 15–41)
Albumin: 3.4 g/dL — ABNORMAL LOW (ref 3.5–5.0)
Alkaline Phosphatase: 91 U/L (ref 38–126)
Anion gap: 10 (ref 5–15)
BUN: 6 mg/dL (ref 6–20)
CALCIUM: 9 mg/dL (ref 8.9–10.3)
CO2: 24 mmol/L (ref 22–32)
Chloride: 106 mmol/L (ref 101–111)
Creatinine, Ser: 0.4 mg/dL — ABNORMAL LOW (ref 0.44–1.00)
GFR calc non Af Amer: >60 mL/min (ref >60)
GLUCOSE: 118 mg/dL — AB (ref 70–99)
Potassium: 3.7 mmol/L (ref 3.5–5.1)
Sodium: 140 mmol/L (ref 135–145)
Total Bilirubin: 0.6 mg/dL (ref 0.3–1.2)
Total Protein: 7.8 g/dL (ref 6.5–8.1)

## 2015-02-22 LAB — POC URINE PREG, ED: Preg Test, Ur: NEGATIVE

## 2015-02-22 MED ORDER — CEPHALEXIN 500 MG PO CAPS: 500.0000 mg | ORAL_CAPSULE | Freq: Four times a day (QID) | ORAL | Status: DC

## 2015-02-22 MED ORDER — MORPHINE SULFATE 4 MG/ML IJ SOLN
4.0000 mg | Freq: Once | INTRAMUSCULAR | Status: AC
  Administered 2015-02-22: 4 mg via INTRAVENOUS
  Filled 2015-02-22: qty 1

## 2015-02-22 MED ORDER — SODIUM CHLORIDE 0.9 % IV BOLUS (SEPSIS)
1000.0000 mL | Freq: Once | INTRAVENOUS | Status: AC
  Administered 2015-02-22: 1000 mL via INTRAVENOUS

## 2015-02-22 MED ORDER — IOHEXOL 300 MG/ML  SOLN
100.0000 mL | Freq: Once | INTRAMUSCULAR | Status: AC | PRN
  Administered 2015-02-22: 100 mL via INTRAVENOUS

## 2015-02-22 MED ORDER — ONDANSETRON HCL 4 MG/2ML IJ SOLN
4.0000 mg | Freq: Once | INTRAMUSCULAR | Status: AC
Start: 2015-02-22 — End: 2015-02-22
  Administered 2015-02-22: 4 mg via INTRAVENOUS
  Filled 2015-02-22: qty 2

## 2015-02-22 MED ORDER — OMEPRAZOLE 20 MG PO CPDR: 20.0000 mg | DELAYED_RELEASE_CAPSULE | Freq: Every day | ORAL | Status: DC

## 2015-02-22 MED ORDER — NAPROXEN 500 MG PO TABS: 500.0000 mg | ORAL_TABLET | Freq: Two times a day (BID) | ORAL | Status: DC

## 2015-02-22 NOTE — ED Notes (Signed)
Pt reports sore throat x 1 year, also c/o earache, epigastric pain, and chest pain, all of which, according to pt have been ongoing issues for quite a while.

## 2015-02-22 NOTE — ED Notes (Signed)
Patient transported to CT 

## 2015-02-22 NOTE — ED Notes (Signed)
Pt c/o sore throat x over a year.  Otalgia x 1 day.  Dental pain, cough, NV x 1 day.  Also c/o CP.

## 2015-02-22 NOTE — ED Provider Notes (Signed)
Complains of sore throat for 1 year. Pain is worse with swallowing. He presents today as pain became worse over the past 2 days. She denies fever. She vomited today. She denies cough denies chest pain. She also complains of pain at tooth that broke off last week which anchors her partial plate. No ear pain. No cough On exam patient is no distress. Handling secretions well. Alert nontoxic. Tooth #20 is fractured. Not loose. No surrounding tenderness or fluctuance of the gingiva. No bleeding at tooth site. Oropharynx is reddened. Uvula midline. Neck is supple. No lymphadenopathy lungs clear auscultation heart regular rate and rhythm CT scan of neck ordered due to chronic pain  Orlie Dakin, MD 02/22/15 2330

## 2015-02-22 NOTE — ED Provider Notes (Signed)
CSN: [id number]     Arrival date & time 02/22/15  1403 History   First MD Initiated Contact with Patient 02/22/15 1424     Chief Complaint  Patient presents with  . Sore Throat  . Dental Pain  . Otalgia   Kelsey West is a 44 y.o. female with a history of fibromyalgia, appendectomy, hiatal hernia and chronic smoker who presents the emergency department complaining of sore throat ongoing for the past year. She complains of 10 out of 10 pain in her throat that is worse with swallowing. She reports being able to swallow but that sometimes food feels like it gets stuck in her throat. Patient reports she saw an ENT 9 months ago who did a scope and suggested a CT scan that was not done. Patient also reports having some epigastric pain with some reflux symptoms into her chest. She reports vomiting 3 times today with some associated nausea. She reports she has vomiting intermittently and did not vomit yesterday. She puts having surgery for a hiatal hernia and 2001. Patient has a 25-pack-year smoking history. Patient reports taking ibuprofen at home with minimal relief. The patient denies fevers, chills, changes to her voice, ear discharge, hematochezia, hematemesis, urinary symptoms, rashes, or diarrhea.   (Consider location/radiation/quality/duration/timing/severity/associated sxs/prior Treatment) HPI  Past Medical History  Diagnosis Date  . Chronic back pain   . Fibromyalgia 01/2014  . Arthritis    Past Surgical History  Procedure Laterality Date  . Hiatal hernia repair    . Tubal ligation    . 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 Appendectomy    . Hernia repair     Family History  Problem Relation Age of Onset  . Thyroid disease     History  Substance Use Topics  . Smoking status: Current Every Day Smoker -- 0.50 packs/day    Types: Cigarettes  . Smokeless tobacco: Not on file  . Alcohol Use: Yes   OB History    No data available     Review of Systems   Constitutional: Negative for fever and chills.  HENT: Positive for ear pain, rhinorrhea, sore throat and trouble swallowing. Negative for congestion, ear discharge, facial swelling, mouth sores, nosebleeds, postnasal drip, tinnitus and voice change.   Eyes: Negative for pain and visual disturbance.  Respiratory: Negative for cough, shortness of breath and wheezing.   Cardiovascular: Positive for chest pain. Negative for palpitations and leg swelling.  Gastrointestinal: Positive for nausea, vomiting and abdominal pain. Negative for diarrhea and blood in stool.  Genitourinary: Negative for dysuria, urgency, frequency, hematuria and difficulty urinating.  Musculoskeletal: Negative for neck pain and neck stiffness.  Skin: Negative for rash.  Neurological: Negative for headaches.      Allergies  Review of patient's allergies indicates no known allergies.  Home Medications   Prior to Admission medications   Medication Sig Start Date End Date Taking? Authorizing Provider  ibuprofen (ADVIL,MOTRIN) 200 MG tablet Take 800 mg by mouth 3 (three) times daily.    Yes Historical Provider, MD  cephALEXin (KEFLEX) 500 MG capsule Take 1 capsule (500 mg total) by mouth 4 (four) times daily. 02/22/15   Waynetta Pean, PA-C  gabapentin (NEURONTIN) 300 MG capsule Take 1 capsule (300 mg total) by mouth 4 (four) times daily. Patient not taking: Reported on 01/09/2015 04/28/14   Charlett Blake, MD  HYDROcodone-acetaminophen (HYCET) 7.5-325 mg/15 ml solution Take 15 mLs by mouth 4 (four) times daily as needed  for moderate pain. Patient not taking: Reported on 01/09/2015 03/11/14 03/11/15  Jeannett Senior, PA-C  hydrocortisone cream 0.5 % Apply topically 2 (two) times daily. Patient not taking: Reported on 01/09/2015 06/10/13   Tresa Garter, MD  naproxen (NAPROSYN) 500 MG tablet Take 1 tablet (500 mg total) by mouth 2 (two) times daily with a meal. 02/22/15   Waynetta Pean, PA-C  omeprazole (PRILOSEC) 20 MG  capsule Take 1 capsule (20 mg total) by mouth daily. 02/22/15   Waynetta Pean, PA-C   BP 129/80 mmHg  Pulse 71  Temp(Src) 98.9 F (37.2 C) (Oral)  Resp 16  SpO2 99% Physical Exam  Constitutional: She is oriented to person, place, and time. She appears well-developed and well-nourished. No distress.  Non-toxic appearing.   HENT:  Head: Normocephalic and atraumatic.  Right Ear: External ear normal.  Left Ear: External ear normal.  Nose: Nose normal.  Mild posterior oropharynx erythema. Uvula is midline without edema. Soft palate rises symmetrically. Patient wearing upper dentures. No tonsillar hypertrophy.  Bilateral tympanic membranes are pearly-gray without erythema or loss of landmarks. Cracked left lower molar. No discharge. No induration. No abscess.   Eyes: Conjunctivae are normal. Pupils are equal, round, and reactive to light. Right eye exhibits no discharge. Left eye exhibits no discharge.  Neck: Normal range of motion. Neck supple. No JVD present. No tracheal deviation present.  Mild tender cervical LAD.   Cardiovascular: Normal rate, regular rhythm, normal heart sounds and intact distal pulses.  Exam reveals no gallop and no friction rub.   No murmur heard. Pulmonary/Chest: Effort normal and breath sounds normal. No respiratory distress. She has no wheezes. She has no rales.  Abdominal: Soft. Bowel sounds are normal. She exhibits no distension and no mass. There is tenderness. There is no rebound and no guarding.  Epigastric tenderness to palpation. Abdomen is soft, bowel sounds present. Not able to reproduce a ventral hernia.   Musculoskeletal: She exhibits no edema.  Lymphadenopathy:    She has cervical adenopathy.  Neurological: She is alert and oriented to person, place, and time. No cranial nerve deficit. Coordination normal.  Skin: Skin is warm and dry. No rash noted. She is not diaphoretic. No erythema. No pallor.  Psychiatric: She has a normal mood and affect. Her  behavior is normal.  Nursing note and vitals reviewed.   ED Course  Procedures (including critical care time) Labs Review Labs Reviewed  COMPREHENSIVE METABOLIC PANEL - Abnormal; Notable for the following:    Glucose, Bld 118 (*)    Creatinine, Ser 0.40 (*)    Albumin 3.4 (*)    ALT 9 (*)    All other components within normal limits  LIPASE, BLOOD - Abnormal; Notable for the following:    Lipase 16 (*)    All other components within normal limits  CBC WITH DIFFERENTIAL/PLATELET - Abnormal; Notable for the following:    WBC 18.8 (*)    Hemoglobin 10.8 (*)    HCT 35.0 (*)    Platelets 629 (*)    Neutrophils Relative % 88 (*)    Neutro Abs 16.4 (*)    Lymphocytes Relative 7 (*)    All other components within normal limits  POC URINE PREG, ED    Imaging Review Ct Soft Tissue Neck W Contrast  02/22/2015   CLINICAL DATA:  Sore throat for over 1 year, otalgia for 1 day, dental pain, cough, nausea, vomiting, history fibromyalgia and smoking  EXAM: CT NECK WITH CONTRAST  TECHNIQUE:  Multidetector CT imaging of the neck was performed using the standard protocol following the bolus administration of intravenous contrast. Sagittal and coronal MPR images reconstructed from axial data set.  CONTRAST:  120mL OMNIPAQUE IOHEXOL 300 MG/ML  SOLN IV  COMPARISON:  None  FINDINGS: Pharynx and larynx: Minimally prominent adenoids. Mild fullness of parapharyngeal lymphoid tissue without discrete mass or abscess. No inflammatory changes/ infiltration of the parapharyngeal spaces identified.  Salivary glands: Normal appearance  Thyroid: Normal appearance  Lymph nodes: Numerous normal sized to minimally enlarged lymph nodes within the anterior cervical regions bilaterally, levels II/III up to 9 mm short axis. Few mildly prominent posterior level V lymph nodes bilaterally, largest on RIGHT 11 mm short axis coronal image 26.  Vascular: Normal appearance  Limited intracranial: Normal appearance  Visualized orbits:  Normal appearance  Mastoids and visualized paranasal sinuses: Clear bilaterally. Middle ear cavities clear bilaterally.  Skeleton: No acute osseous abnormalities.  Upper chest: Normal appearance, few normal size superior mediastinal lymph nodes noted.  IMPRESSION: Scattered normal sized to minimally prominent lymph nodes bilaterally levels II, III and V, nonspecific.  No discrete/dominant lymph node mass identified.  No acute parapharyngeal or cervical inflammatory process.   Electronically Signed   By: Lavonia Dana M.D.   On: 02/22/2015 17:27     EKG Interpretation None     ED ECG REPORT   Date: 02/22/2015  Rate: 95   Rhythm: normal sinus rhythm  QRS Axis: normal  Intervals: normal  ST/T Wave abnormalities: normal  Conduction Disutrbances:none  Narrative Interpretation:   Old EKG Reviewed: none available  I have personally reviewed the EKG tracing and disagree with the computerized printout as noted. No minimal ST depression on lateral leads as noted by computerized report. EKG also interpreted by Dr. Winfred Leeds.   Filed Vitals:   02/22/15 1411 02/22/15 1613 02/22/15 1809  BP: 137/84 137/87 129/80  Pulse: 100 82 71  Temp: 98.1 F (36.7 C) 98.3 F (36.8 C) 98.9 F (37.2 C)  TempSrc: Oral Oral Oral  Resp: 18 16 16   SpO2: 100% 94% 99%    MDM   Meds given in ED:  Medications  sodium chloride 0.9 % bolus 1,000 mL (0 mLs Intravenous Stopped 02/22/15 1759)  ondansetron (ZOFRAN) injection 4 mg (4 mg Intravenous Given 02/22/15 1612)  morphine 4 MG/ML injection 4 mg (4 mg Intravenous Given 02/22/15 1612)  iohexol (OMNIPAQUE) 300 MG/ML solution 100 mL (100 mLs Intravenous Contrast Given 02/22/15 1707)    Discharge Medication List as of 02/22/2015  5:50 PM    START taking these medications   Details  cephALEXin (KEFLEX) 500 MG capsule Take 1 capsule (500 mg total) by mouth 4 (four) times daily., Starting 02/22/2015, Until Discontinued, Print    naproxen (NAPROSYN) 500 MG tablet Take 1  tablet (500 mg total) by mouth 2 (two) times daily with a meal., Starting 02/22/2015, Until Discontinued, Print        Final diagnoses:  Cervical lymphadenitis  Pain, dental  Non-intractable vomiting with nausea, vomiting of unspecified type   This is a 44 y.o. female with a history of fibromyalgia, appendectomy, hiatal hernia and chronic smoker who presents the emergency department complaining of sore throat ongoing for the past year. She complains of 10 out of 10 pain in her throat that is worse with swallowing. She reports being able to swallow but that sometimes food feels like it gets stuck in her throat. Patient reports she saw an ENT 9 months ago who did a  scope and suggested a CT scan that was not done.  She also reports some nausea and vomiting starting today. On exam the patient is afebrile and nontoxic appearing. She has some mild oropharyngeal erythema. There is also mild tender cervical lymphadenopathy. She has normal range of motion of her neck. She has mild epigastric tenderness, which she attributes to her hiatal hernia. The patient has a negative urine pregnancy test. Her CMP is unremarkable. Her lipase is 16. The patient's white count is elevated at 18.8. Her platelets are elevated at 629, this is consistent with her previous blood work. I advised the patient of her elevated platelets and she reports that she heard he was aware of this. I advised her to follow-up with her primary care provider. Will obtain CT scan of her soft tissues neck due to her ongoing throat pain.  Her CT scan showed scattered normal-sized minimally prominent lymph nodes bilaterally. There is no discrete or dominant lymph node mass identified. There is no parapharyngeal or cervical inflammatory process. After discussing with Dr. Winfred Leeds will treat this patient for cervical lymphadenitis with keflex and have her follow up with her PCP. Patient provided with a prescription for naproxen and I refilled her Prilosec.  Patient also provided with referral to dentistry for her cracked molar. She is tolerated by mouth in the ED. She has had no vomiting while in the ED.  I advised the patient to follow-up with their primary care provider this week. I advised the patient to return to the emergency department with new or worsening symptoms or new concerns. The patient verbalized understanding and agreement with plan.   This patient was discussed with and evaluated by Dr. Winfred Leeds who agrees with assessment and plan.     Waynetta Pean, PA-C 02/23/15 0258  Orlie Dakin, MD 02/25/15 [id number]

## 2015-02-22 NOTE — Discharge Instructions (Signed)
Cervical Adenitis You have a swollen lymph gland in your neck. This commonly happens with virus infections, dental problems, insect bites, and injuries about the face, scalp, or neck. The lymph glands swell as the body fights the infection or heals the injury. Swelling and firmness typically lasts for several weeks after the infection or injury is healed. Rarely lymph glands can become swollen because of cancer or TB. Antibiotics are prescribed if there is evidence of an infection. Sometimes an infected lymph gland becomes filled with pus. This condition may require opening up the abscessed gland by draining it surgically. Most of the time infected glands return to normal within two weeks. Do not poke or squeeze the swollen lymph nodes. That may keep them from shrinking back to their normal size. If the lymph gland is still swollen after 2 weeks, further medical evaluation is needed.  SEEK IMMEDIATE MEDICAL CARE IF:  You have difficulty swallowing or breathing, increased swelling, severe pain, or a high fever.  Document Released: 10/03/2005 Document Revised: 12/26/2011 Document Reviewed: 03/25/2007 [facility] Patient Information 2015 [place], [place]. This information is not intended to replace advice given to you by your health care provider. Make sure you discuss any questions you have with your health care provider. Lymphadenopathy Lymphadenopathy means "disease of the lymph glands." But the term is usually used to describe swollen or enlarged lymph glands, also called lymph nodes. These are the bean-shaped organs found in many locations including the neck, underarm, and groin. Lymph glands are part of the immune system, which fights infections in your body. Lymphadenopathy can occur in just one area of the body, such as the neck, or it can be generalized, with lymph node enlargement in several areas. The nodes found in the neck are the most common sites of lymphadenopathy. CAUSES When your immune system  responds to germs (such as viruses or bacteria ), infection-fighting cells and fluid build up. This causes the glands to grow in size. Usually, this is not something to worry about. Sometimes, the glands themselves can become infected and inflamed. This is called lymphadenitis. Enlarged lymph nodes can be caused by many diseases:  Bacterial disease, such as strep throat or a skin infection.  Viral disease, such as a common cold.  Other germs, such as Lyme disease, tuberculosis, or sexually transmitted diseases.  Cancers, such as lymphoma (cancer of the lymphatic system) or leukemia (cancer of the white blood cells).  Inflammatory diseases such as lupus or rheumatoid arthritis.  Reactions to medications. Many of the diseases above are rare, but important. This is why you should see your caregiver if you have lymphadenopathy. SYMPTOMS  Swollen, enlarged lumps in the neck, back of the head, or other locations.  Tenderness.  Warmth or redness of the skin over the lymph nodes.  Fever. DIAGNOSIS Enlarged lymph nodes are often near the source of infection. They can help health care providers diagnose your illness. For instance:  Swollen lymph nodes around the jaw might be caused by an infection in the mouth.  Enlarged glands in the neck often signal a throat infection.  Lymph nodes that are swollen in more than one area often indicate an illness caused by a virus. Your caregiver will likely know what is causing your lymphadenopathy after listening to your history and examining you. Blood tests, x-rays, or other tests may be needed. If the cause of the enlarged lymph node cannot be found, and it does not go away by itself, then a biopsy may be needed. Your  caregiver will discuss this with you. TREATMENT Treatment for your enlarged lymph nodes will depend on the cause. Many times the nodes will shrink to normal size by themselves, with no treatment. Antibiotics or other medicines may be  needed for infection. Only take over-the-counter or prescription medicines for pain, discomfort, or fever as directed by your caregiver. HOME CARE INSTRUCTIONS Swollen lymph glands usually return to normal when the underlying medical condition goes away. If they persist, contact your health-care provider. He/she might prescribe antibiotics or other treatments, depending on the diagnosis. Take any medications exactly as prescribed. Keep any follow-up appointments made to check on the condition of your enlarged nodes. SEEK MEDICAL CARE IF:  Swelling lasts for more than two weeks.  You have symptoms such as weight loss, night sweats, fatigue, or fever that does not go away.  The lymph nodes are hard, seem fixed to the skin, or are growing rapidly.  Skin over the lymph nodes is red and inflamed. This could mean there is an infection. SEEK IMMEDIATE MEDICAL CARE IF:  Fluid starts leaking from the area of the enlarged lymph node.  You develop a fever of 102 F (38.9 C) or greater.  Severe pain develops (not necessarily at the site of a large lymph node).  You develop chest pain or shortness of breath.  You develop worsening abdominal pain. MAKE SURE YOU:  Understand these instructions.  Will watch your condition.  Will get help right away if you are not doing well or get worse. Document Released: 07/12/2008 Document Revised: 02/17/2014 Document Reviewed: 07/12/2008 [facility] Patient Information 2015 [place], [place]. This information is not intended to replace advice given to you by your health care provider. Make sure you discuss any questions you have with your health care provider.  Dental Pain A tooth ache may be caused by cavities (tooth decay). Cavities expose the nerve of the tooth to air and hot or cold temperatures. It may come from an infection or abscess (also called a boil or furuncle) around your tooth. It is also often caused by dental caries (tooth decay). This causes the  pain you are having. DIAGNOSIS  Your caregiver can diagnose this problem by exam. TREATMENT   If caused by an infection, it may be treated with medications which kill germs (antibiotics) and pain medications as prescribed by your caregiver. Take medications as directed.  Only take over-the-counter or prescription medicines for pain, discomfort, or fever as directed by your caregiver.  Whether the tooth ache today is caused by infection or dental disease, you should see your dentist as soon as possible for further care. SEEK MEDICAL CARE IF: The exam and treatment you received today has been provided on an emergency basis only. This is not a substitute for complete medical or dental care. If your problem worsens or new problems (symptoms) appear, and you are unable to meet with your dentist, call or return to this location. SEEK IMMEDIATE MEDICAL CARE IF:   You have a fever.  You develop redness and swelling of your face, jaw, or neck.  You are unable to open your mouth.  You have severe pain uncontrolled by pain medicine. MAKE SURE YOU:   Understand these instructions.  Will watch your condition.  Will get help right away if you are not doing well or get worse. Document Released: 10/03/2005 Document Revised: 12/26/2011 Document Reviewed: 05/21/2008 [facility] Patient Information 2015 [place], [place]. This information is not intended to replace advice given to you by your health care  provider. Make sure you discuss any questions you have with your health care provider. Nausea and Vomiting Nausea is a sick feeling that often comes before throwing up (vomiting). Vomiting is a reflex where stomach contents come out of your mouth. Vomiting can cause severe loss of body fluids (dehydration). Children and elderly adults can become dehydrated quickly, especially if they also have diarrhea. Nausea and vomiting are symptoms of a condition or disease. It is important to find the cause of your  symptoms. CAUSES   Direct irritation of the stomach lining. This irritation can result from increased acid production (gastroesophageal reflux disease), infection, food poisoning, taking certain medicines (such as nonsteroidal anti-inflammatory drugs), alcohol use, or tobacco use.  Signals from the brain.These signals could be caused by a headache, heat exposure, an inner ear disturbance, increased pressure in the brain from injury, infection, a tumor, or a concussion, pain, emotional stimulus, or metabolic problems.  An obstruction in the gastrointestinal tract (bowel obstruction).  Illnesses such as diabetes, hepatitis, gallbladder problems, appendicitis, kidney problems, cancer, sepsis, atypical symptoms of a heart attack, or eating disorders.  Medical treatments such as chemotherapy and radiation.  Receiving medicine that makes you sleep (general anesthetic) during surgery. DIAGNOSIS Your caregiver may ask for tests to be done if the problems do not improve after a few days. Tests may also be done if symptoms are severe or if the reason for the nausea and vomiting is not clear. Tests may include:  Urine tests.  Blood tests.  Stool tests.  Cultures (to look for evidence of infection).  X-rays or other imaging studies. Test results can help your caregiver make decisions about treatment or the need for additional tests. TREATMENT You need to stay well hydrated. Drink frequently but in small amounts.You may wish to drink water, sports drinks, clear broth, or eat frozen ice pops or gelatin dessert to help stay hydrated.When you eat, eating slowly may help prevent nausea.There are also some antinausea medicines that may help prevent nausea. HOME CARE INSTRUCTIONS   Take all medicine as directed by your caregiver.  If you do not have an appetite, do not force yourself to eat. However, you must continue to drink fluids.  If you have an appetite, eat a normal diet unless your  caregiver tells you differently.  Eat a variety of complex carbohydrates (rice, wheat, potatoes, bread), lean meats, yogurt, fruits, and vegetables.  Avoid high-fat foods because they are more difficult to digest.  Drink enough water and fluids to keep your urine clear or pale yellow.  If you are dehydrated, ask your caregiver for specific rehydration instructions. Signs of dehydration may include:  Severe thirst.  Dry lips and mouth.  Dizziness.  Dark urine.  Decreasing urine frequency and amount.  Confusion.  Rapid breathing or pulse. SEEK IMMEDIATE MEDICAL CARE IF:   You have blood or brown flecks (like coffee grounds) in your vomit.  You have black or bloody stools.  You have a severe headache or stiff neck.  You are confused.  You have severe abdominal pain.  You have chest pain or trouble breathing.  You do not urinate at least once every 8 hours.  You develop cold or clammy skin.  You continue to vomit for longer than 24 to 48 hours.  You have a fever. MAKE SURE YOU:   Understand these instructions.  Will watch your condition.  Will get help right away if you are not doing well or get worse. Document Released: 10/03/2005 Document Revised:  12/26/2011 Document Reviewed: 03/02/2011 [employer] Patient Information 2015 [place], [place]. This information is not intended to replace advice given to you by your health care provider. Make sure you discuss any questions you have with your health care provider.

## 2015-05-14 ENCOUNTER — Telehealth: Payer: Self-pay | Admitting: Hematology

## 2015-05-14 NOTE — Telephone Encounter (Signed)
New Patient appt-s/w patient and gave np appt for 08/01 @ 1:30 w/Dr. Irene Limbo Referring Dr. Antony Blackbird Dx- leukocytosis   Referral scanned

## 2015-05-18 ENCOUNTER — Ambulatory Visit (HOSPITAL_BASED_OUTPATIENT_CLINIC_OR_DEPARTMENT_OTHER): Payer: Self-pay | Admitting: Hematology

## 2015-05-18 ENCOUNTER — Ambulatory Visit: Payer: Self-pay

## 2015-05-18 ENCOUNTER — Encounter: Payer: Self-pay | Admitting: Hematology

## 2015-05-18 ENCOUNTER — Encounter (HOSPITAL_BASED_OUTPATIENT_CLINIC_OR_DEPARTMENT_OTHER): Payer: Self-pay | Admitting: Hematology

## 2015-05-18 ENCOUNTER — Telehealth: Payer: Self-pay | Admitting: Hematology

## 2015-05-18 VITALS — BP 142/88 | HR 90 | Temp 98.1°F | Resp 18 | Ht 65.0 in | Wt 161.4 lb

## 2015-05-18 DIAGNOSIS — R61 Generalized hyperhidrosis: Secondary | ICD-10-CM

## 2015-05-18 DIAGNOSIS — D473 Essential (hemorrhagic) thrombocythemia: Secondary | ICD-10-CM

## 2015-05-18 DIAGNOSIS — E876 Hypokalemia: Secondary | ICD-10-CM

## 2015-05-18 DIAGNOSIS — D72819 Decreased white blood cell count, unspecified: Secondary | ICD-10-CM

## 2015-05-18 DIAGNOSIS — D729 Disorder of white blood cells, unspecified: Secondary | ICD-10-CM

## 2015-05-18 DIAGNOSIS — D75839 Thrombocytosis, unspecified: Secondary | ICD-10-CM

## 2015-05-18 DIAGNOSIS — N63 Unspecified lump in breast: Secondary | ICD-10-CM

## 2015-05-18 DIAGNOSIS — R0602 Shortness of breath: Secondary | ICD-10-CM

## 2015-05-18 LAB — MORPHOLOGY
PLT EST: INCREASED
RBC COMMENTS: NORMAL

## 2015-05-18 LAB — COMPREHENSIVE METABOLIC PANEL (CC13)
ALBUMIN: 3.7 g/dL (ref 3.5–5.0)
ALT: 14 U/L (ref 0–55)
AST: 18 U/L (ref 5–34)
Alkaline Phosphatase: 82 U/L (ref 40–150)
Anion Gap: 8 mEq/L (ref 3–11)
BILIRUBIN TOTAL: 0.56 mg/dL (ref 0.20–1.20)
BUN: 6.6 mg/dL — ABNORMAL LOW (ref 7.0–26.0)
CALCIUM: 9.1 mg/dL (ref 8.4–10.4)
CO2: 27 mEq/L (ref 22–29)
CREATININE: 0.6 mg/dL (ref 0.6–1.1)
Chloride: 105 mEq/L (ref 98–109)
EGFR: >90 mL/min/{1.73_m2} (ref >90)
GLUCOSE: 96 mg/dL (ref 70–140)
Potassium: 2.8 mEq/L — CL (ref 3.5–5.1)
Sodium: 139 mEq/L (ref 136–145)
Total Protein: 7.4 g/dL (ref 6.4–8.3)

## 2015-05-18 LAB — CBC & DIFF AND RETIC
BASO%: 0.3 % (ref 0.0–2.0)
Basophils Absolute: 0 10*3/uL (ref 0.0–0.1)
EOS%: 1.7 % (ref 0.0–7.0)
Eosinophils Absolute: 0.2 10*3/uL (ref 0.0–0.5)
HCT: 35.4 % (ref 34.8–46.6)
HGB: 11 g/dL — ABNORMAL LOW (ref 11.6–15.9)
Immature Retic Fract: 7.8 % (ref 1.60–10.00)
LYMPH%: 20.1 % (ref 14.0–49.7)
MCH: 25.9 pg (ref 25.1–34.0)
MCHC: 31.1 g/dL — AB (ref 31.5–36.0)
MCV: 83.3 fL (ref 79.5–101.0)
MONO#: 0.9 10*3/uL (ref 0.1–0.9)
MONO%: 6.9 % (ref 0.0–14.0)
NEUT#: 8.9 10*3/uL — ABNORMAL HIGH (ref 1.5–6.5)
NEUT%: 71 % (ref 38.4–76.8)
NRBC: 0 % (ref 0–0)
PLATELETS: 453 10*3/uL — AB (ref 145–400)
RBC: 4.25 10*6/uL (ref 3.70–5.45)
RDW: 14.6 % — AB (ref 11.2–14.5)
RETIC CT ABS: 54.83 10*3/uL (ref 33.70–90.70)
Retic %: 1.29 % (ref 0.70–2.10)
WBC: 12.5 10*3/uL — AB (ref 3.9–10.3)
lymph#: 2.5 10*3/uL (ref 0.9–3.3)

## 2015-05-18 LAB — CHCC SMEAR

## 2015-05-18 LAB — LACTATE DEHYDROGENASE (CC13): LDH: 170 U/L (ref 125–245)

## 2015-05-18 NOTE — Patient Instructions (Signed)
Smoking Cessation Quitting smoking is important to your health and has many advantages. However, it is not always easy to quit since nicotine is a very addictive drug. Oftentimes, people try 3 times or more before being able to quit. This document explains the best ways for you to prepare to quit smoking. Quitting takes hard work and a lot of effort, but you can do it. ADVANTAGES OF QUITTING SMOKING  You will live longer, feel better, and live better.  Your body will feel the impact of quitting smoking almost immediately.  Within 20 minutes, blood pressure decreases. Your pulse returns to its normal level.  After 8 hours, carbon monoxide levels in the blood return to normal. Your oxygen level increases.  After 24 hours, the chance of having a heart attack starts to decrease. Your breath, hair, and body stop smelling like smoke.  After 48 hours, damaged nerve endings begin to recover. Your sense of taste and smell improve.  After 72 hours, the body is virtually free of nicotine. Your bronchial tubes relax and breathing becomes easier.  After 2 to 12 weeks, lungs can hold more air. Exercise becomes easier and circulation improves.  The risk of having a heart attack, stroke, cancer, or lung disease is greatly reduced.  After 1 year, the risk of coronary heart disease is cut in half.  After 5 years, the risk of stroke falls to the same as a nonsmoker.  After 10 years, the risk of lung cancer is cut in half and the risk of other cancers decreases significantly.  After 15 years, the risk of coronary heart disease drops, usually to the level of a nonsmoker.  If you are pregnant, quitting smoking will improve your chances of having a healthy baby.  The people you live with, especially any children, will be healthier.  You will have extra money to spend on things other than cigarettes. QUESTIONS TO THINK ABOUT BEFORE ATTEMPTING TO QUIT You may want to talk about your answers with your  health care provider.  Why do you want to quit?  If you tried to quit in the past, what helped and what did not?  What will be the most difficult situations for you after you quit? How will you plan to handle them?  Who can help you through the tough times? Your family? Friends? A health care provider?  What pleasures do you get from smoking? What ways can you still get pleasure if you quit? Here are some questions to ask your health care provider:  How can you help me to be successful at quitting?  What medicine do you think would be best for me and how should I take it?  What should I do if I need more help?  What is smoking withdrawal like? How can I get information on withdrawal? GET READY  Set a quit date.  Change your environment by getting rid of all cigarettes, ashtrays, matches, and lighters in your home, car, or work. Do not let people smoke in your home.  Review your past attempts to quit. Think about what worked and what did not. GET SUPPORT AND ENCOURAGEMENT You have a better chance of being successful if you have help. You can get support in many ways.  Tell your family, friends, and coworkers that you are going to quit and need their support. Ask them not to smoke around you.  Get individual, group, or telephone counseling and support. Programs are available at local hospitals and health centers. Call   your local health department for information about programs in your area.  Spiritual beliefs and practices may help some smokers quit.  Download a "quit meter" on your computer to keep track of quit statistics, such as how long you have gone without smoking, cigarettes not smoked, and money saved.  Get a self-help book about quitting smoking and staying off tobacco. LEARN NEW SKILLS AND BEHAVIORS  Distract yourself from urges to smoke. Talk to someone, go for a walk, or occupy your time with a task.  Change your normal routine. Take a different route to work.  Drink tea instead of coffee. Eat breakfast in a different place.  Reduce your stress. Take a hot bath, exercise, or read a book.  Plan something enjoyable to do every day. Reward yourself for not smoking.  Explore interactive web-based programs that specialize in helping you quit. GET MEDICINE AND USE IT CORRECTLY Medicines can help you stop smoking and decrease the urge to smoke. Combining medicine with the above behavioral methods and support can greatly increase your chances of successfully quitting smoking.  Nicotine replacement therapy helps deliver nicotine to your body without the negative effects and risks of smoking. Nicotine replacement therapy includes nicotine gum, lozenges, inhalers, nasal sprays, and skin patches. Some may be available over-the-counter and others require a prescription.  Antidepressant medicine helps people abstain from smoking, but how this works is unknown. This medicine is available by prescription.  Nicotinic receptor partial agonist medicine simulates the effect of nicotine in your brain. This medicine is available by prescription. Ask your health care provider for advice about which medicines to use and how to use them based on your health history. Your health care provider will tell you what side effects to look out for if you choose to be on a medicine or therapy. Carefully read the information on the package. Do not use any other product containing nicotine while using a nicotine replacement product.  RELAPSE OR DIFFICULT SITUATIONS Most relapses occur within the first 3 months after quitting. Do not be discouraged if you start smoking again. Remember, most people try several times before finally quitting. You may have symptoms of withdrawal because your body is used to nicotine. You may crave cigarettes, be irritable, feel very hungry, cough often, get headaches, or have difficulty concentrating. The withdrawal symptoms are only temporary. They are strongest  when you first quit, but they will go away within 10-14 days. To reduce the chances of relapse, try to:  Avoid drinking alcohol. Drinking lowers your chances of successfully quitting.  Reduce the amount of caffeine you consume. Once you quit smoking, the amount of caffeine in your body increases and can give you symptoms, such as a rapid heartbeat, sweating, and anxiety.  Avoid smokers because they can make you want to smoke.  Do not let weight gain distract you. Many smokers will gain weight when they quit, usually less than 10 pounds. Eat a healthy diet and stay active. You can always lose the weight gained after you quit.  Find ways to improve your mood other than smoking. FOR MORE INFORMATION  www.smokefree.gov  Document Released: 09/27/2001 Document Revised: 02/17/2014 Document Reviewed: 01/12/2012 Exit[employer] Patient Information 2015 [employer]. This information is not intended to replace advice given to you by your health care provider. Make sure you discuss any questions you have with your health care provider.  

## 2015-05-18 NOTE — Progress Notes (Signed)
Checked in new pt with no insurance.  Gave pt a Medicaid application and an application to apply for a discount thru the hospital since she has an outstanding balance on the hospital side.  Informed pt she will receive a 55% discount since she's self-pay.  Pt verbalized understanding.

## 2015-05-18 NOTE — Telephone Encounter (Signed)
Appointments made and avs pritned for patent
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
## 2015-05-19 ENCOUNTER — Telehealth: Payer: Self-pay | Admitting: Hematology

## 2015-05-19 LAB — TSH CHCC: TSH: 1.301 m(IU)/L (ref 0.308–3.960)

## 2015-05-19 MED ORDER — POTASSIUM CHLORIDE ER 20 MEQ PO TBCR: 40.0000 meq | EXTENDED_RELEASE_TABLET | Freq: Every day | ORAL | Status: DC

## 2015-05-19 NOTE — Telephone Encounter (Signed)
Kelsey West was called on her cell phone number listed in our system to discuss her low potassium levels.  I called several times but was directed to voicemail.  I left her a message about your low potassium levels and the need to eat foods rich in potassium as well as start taking oral potassium supplementation.  A prescription for potassium was sent to her [facility] and she was instructed about this.  We will follow up with patient to ensure she takes her potassium. Will msg my RN to f/u as well.

## 2015-05-20 ENCOUNTER — Telehealth: Payer: Self-pay | Admitting: *Deleted

## 2015-05-20 ENCOUNTER — Other Ambulatory Visit: Payer: Self-pay | Admitting: Hematology

## 2015-05-20 ENCOUNTER — Encounter: Payer: Self-pay | Admitting: Hematology

## 2015-05-20 DIAGNOSIS — Z1231 Encounter for screening mammogram for malignant neoplasm of breast: Secondary | ICD-10-CM

## 2015-05-20 LAB — SEDIMENTATION RATE: Sed Rate: 10 mm/hr (ref 0–20)

## 2015-05-20 LAB — ANA: ANA: NEGATIVE

## 2015-05-20 LAB — T4, FREE: Free T4: 1.07 ng/dL (ref 0.80–1.80)

## 2015-05-20 NOTE — Telephone Encounter (Signed)
Informed patient on potassium prescription. Patient verbalized understanding.

## 2015-05-21 ENCOUNTER — Other Ambulatory Visit: Payer: Self-pay | Admitting: Hematology

## 2015-05-25 ENCOUNTER — Encounter (HOSPITAL_COMMUNITY): Payer: Self-pay

## 2015-05-25 ENCOUNTER — Ambulatory Visit (HOSPITAL_COMMUNITY)
Admission: RE | Admit: 2015-05-25 | Discharge: 2015-05-25 | Disposition: A | Discharge status: Home / Self Care | Payer: Self-pay | Source: Ambulatory Visit | Attending: Hematology | Admitting: Hematology

## 2015-05-25 DIAGNOSIS — R918 Other nonspecific abnormal finding of lung field: Secondary | ICD-10-CM | POA: Insufficient documentation

## 2015-05-25 DIAGNOSIS — J432 Centrilobular emphysema: Secondary | ICD-10-CM | POA: Insufficient documentation

## 2015-05-25 DIAGNOSIS — R0602 Shortness of breath: Secondary | ICD-10-CM | POA: Insufficient documentation

## 2015-05-25 DIAGNOSIS — K449 Diaphragmatic hernia without obstruction or gangrene: Secondary | ICD-10-CM | POA: Insufficient documentation

## 2015-05-25 DIAGNOSIS — D72829 Elevated white blood cell count, unspecified: Secondary | ICD-10-CM | POA: Insufficient documentation

## 2015-05-25 MED ORDER — IOHEXOL 300 MG/ML  SOLN
75.0000 mL | Freq: Once | INTRAMUSCULAR | Status: AC | PRN
  Administered 2015-05-25: 75 mL via INTRAVENOUS

## 2015-05-25 NOTE — Progress Notes (Signed)
This encounter was created in error - please disregard.  This encounter was created in error - please disregard.

## 2015-05-27 LAB — CALR MUTATAION(GENPATH)

## 2015-05-28 ENCOUNTER — Ambulatory Visit (HOSPITAL_COMMUNITY)
Admission: RE | Admit: 2015-05-28 | Discharge: 2015-05-28 | Disposition: A | Discharge status: Home / Self Care | Payer: Self-pay | Source: Ambulatory Visit | Attending: Hematology | Admitting: Hematology

## 2015-05-28 ENCOUNTER — Other Ambulatory Visit: Payer: Self-pay | Admitting: Hematology

## 2015-05-28 DIAGNOSIS — Z1231 Encounter for screening mammogram for malignant neoplasm of breast: Secondary | ICD-10-CM

## 2015-05-28 DIAGNOSIS — N63 Unspecified lump in unspecified breast: Secondary | ICD-10-CM

## 2015-06-01 ENCOUNTER — Ambulatory Visit (HOSPITAL_BASED_OUTPATIENT_CLINIC_OR_DEPARTMENT_OTHER): Payer: Self-pay | Admitting: Hematology

## 2015-06-01 VITALS — BP 137/86 | HR 77 | Temp 97.7°F | Resp 18 | Ht 65.0 in | Wt 159.1 lb

## 2015-06-01 DIAGNOSIS — N63 Unspecified lump in unspecified breast: Secondary | ICD-10-CM

## 2015-06-01 DIAGNOSIS — D75839 Thrombocytosis, unspecified: Secondary | ICD-10-CM

## 2015-06-01 DIAGNOSIS — D473 Essential (hemorrhagic) thrombocythemia: Secondary | ICD-10-CM

## 2015-06-01 DIAGNOSIS — E876 Hypokalemia: Secondary | ICD-10-CM

## 2015-06-01 DIAGNOSIS — D72829 Elevated white blood cell count, unspecified: Secondary | ICD-10-CM

## 2015-06-05 ENCOUNTER — Encounter: Payer: Self-pay | Admitting: Hematology
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
## 2016-08-10 DIAGNOSIS — Y999 Unspecified external cause status: Secondary | ICD-10-CM | POA: Diagnosis not present

## 2016-08-10 DIAGNOSIS — W1849XA Other slipping, tripping and stumbling without falling, initial encounter: Secondary | ICD-10-CM | POA: Diagnosis not present

## 2016-08-10 DIAGNOSIS — M25512 Pain in left shoulder: Secondary | ICD-10-CM | POA: Diagnosis not present

## 2016-08-10 DIAGNOSIS — F17210 Nicotine dependence, cigarettes, uncomplicated: Secondary | ICD-10-CM | POA: Diagnosis not present

## 2016-08-10 DIAGNOSIS — Y939 Activity, unspecified: Secondary | ICD-10-CM | POA: Insufficient documentation

## 2016-08-10 DIAGNOSIS — Y929 Unspecified place or not applicable: Secondary | ICD-10-CM | POA: Insufficient documentation

## 2016-08-10 DIAGNOSIS — Z79899 Other long term (current) drug therapy: Secondary | ICD-10-CM | POA: Insufficient documentation

## 2016-08-11 ENCOUNTER — Encounter (HOSPITAL_COMMUNITY): Payer: Self-pay

## 2016-08-11 ENCOUNTER — Emergency Department (HOSPITAL_COMMUNITY)
Admission: EM | Admit: 2016-08-11 | Discharge: 2016-08-11 | Disposition: A | Discharge status: Home / Self Care | Payer: Managed Care, Other (non HMO) | Attending: Emergency Medicine | Admitting: Emergency Medicine

## 2016-08-11 ENCOUNTER — Emergency Department (HOSPITAL_COMMUNITY): Payer: Managed Care, Other (non HMO)

## 2016-08-11 DIAGNOSIS — M25512 Pain in left shoulder: Secondary | ICD-10-CM

## 2016-08-11 MED ORDER — IBUPROFEN 600 MG PO TABS: 600.0000 mg | ORAL_TABLET | Freq: Four times a day (QID) | ORAL | 0 refills | Status: DC | PRN

## 2016-08-11 NOTE — Discharge Instructions (Signed)
Take your medication as prescribed. I also recommend applying ice to affected area for 15 minutes 3-4 times daily for additional pain relief. I recommend calling the orthopedic office listed above if her pain has not improved over the next week. Please return to the Emergency Department if symptoms worsen or new onset of fever, swelling, redness, numbness, tingling, weakness.

## 2016-08-11 NOTE — ED Triage Notes (Signed)
Pt complains of left shoulder pain since Sunday evening, no injury remembered but possibly using it to catch herself from falling

## 2016-08-11 NOTE — ED Provider Notes (Signed)
[facility] DEPT Provider Note   CSN: [id number] Arrival date & time: 08/10/16  2343     History   Chief Complaint Chief Complaint  Patient presents with  . Shoulder Pain    HPI Kelsey West is a 45 y.o. female.  Patient is a 45 year old female with no pertinent past medical history presents the ED with complaint of left shoulder pain, onset 4 days. Patient reports on Sunday evening she tripped over one of her cats and caught herself with her left arm to prevent herself from falling. She notes since then she has had pain and weakness to her left shoulder. She reports the pain is constant She states she has been taking Tylenol at home without relief. Denies fever, swelling, redness, numbness, tingling. Patient denies any prior injury to her left shoulder.      Past Medical History:  Diagnosis Date  . Arthritis   . Chronic back pain   . Degenerative disc disease, lumbar   . Fibromyalgia 01/2014  . Incisional hernia   . Smoker     Patient Active Problem List   Diagnosis Date Noted  . Fibromyalgia syndrome 02/11/2014  . Leukocytosis, unspecified 11/26/2013  . Encounter for Pap smear 06/10/2013  . Chronic back pain 05/16/2013  . Sciatica of right side 05/16/2013    Past Surgical History:  Procedure Laterality Date  . APPENDECTOMY    . HERNIA REPAIR    . HIATAL HERNIA REPAIR  2001  . TUBAL LIGATION    . TUMOR REMOVAL     from right pinky finger    OB History    No data available       Home Medications    Prior to Admission medications   Medication Sig Start Date End Date Taking? Authorizing Provider  cephALEXin (KEFLEX) 500 MG capsule Take 1 capsule (500 mg total) by mouth 4 (four) times daily. 02/22/15   Waynetta Pean, PA-C  gabapentin (NEURONTIN) 300 MG capsule Take 1 capsule (300 mg total) by mouth 4 (four) times daily. 04/28/14   Charlett Blake, MD  hydrocortisone cream 0.5 % Apply topically 2 (two) times daily. 06/10/13   Tresa Garter,  MD  ibuprofen (ADVIL,MOTRIN) 600 MG tablet Take 1 tablet (600 mg total) by mouth every 6 (six) hours as needed. 08/11/16   Nona Dell, PA-C  naproxen (NAPROSYN) 500 MG tablet Take 1 tablet (500 mg total) by mouth 2 (two) times daily with a meal. 02/22/15   Waynetta Pean, PA-C  omeprazole (PRILOSEC) 20 MG capsule Take 1 capsule (20 mg total) by mouth daily. 02/22/15   Waynetta Pean, PA-C  potassium chloride 20 MEQ TBCR Take 40 mEq by mouth daily. Take 47meq twice daily for 2 days then once daily till finished. 05/19/15   Brunetta Genera, MD    Family History Family History  Problem Relation Age of Onset  . Thyroid disease Mother   . Other Brother     Polyarteritis nodosa  . Thyroid disease Paternal Grandmother   . Cancer Paternal Grandmother     Social History Social History  Substance Use Topics  . Smoking status: Current Every Day Smoker    Packs/day: 0.50    Years: 30.00    Types: Cigarettes  . Smokeless tobacco: Never Used  . Alcohol use No     Comment: one beer every 6 months     Allergies   Review of patient's allergies indicates no known allergies.   Review of Systems Review of Systems  Constitutional: Negative for fever.  Musculoskeletal: Positive for arthralgias (left shoulder).  Skin: Negative for wound.  Neurological: Positive for weakness. Negative for numbness.     Physical Exam Updated Vital Signs BP 122/74   Pulse 78   Temp 97.5 F (36.4 C) (Oral)   Resp 18   Ht 5\' 6"  (1.676 m)   Wt 72.3 kg   LMP 07/28/2016   SpO2 100%   BMI 25.74 kg/m   Physical Exam  Constitutional: She is oriented to person, place, and time. She appears well-developed and well-nourished.  HENT:  Head: Normocephalic and atraumatic.  Eyes: Conjunctivae and EOM are normal. Right eye exhibits no discharge. Left eye exhibits no discharge. No scleral icterus.  Pulmonary/Chest: Effort normal.  Musculoskeletal: She exhibits tenderness. She exhibits no edema or  deformity.       Left shoulder: She exhibits decreased range of motion (due to reported pain), tenderness and decreased strength. She exhibits no swelling, no effusion, no crepitus, no deformity, no laceration and normal pulse.  FROM of left elbow, forearm and wrist. No TTP over left clavicle or lower arm. 2+ radial pulse. Sensation grossly intact. Equal grip strength bilaterally. Positive Empty can test. Positive Apley test.  Neurological: She is alert and oriented to person, place, and time.  Nursing note and vitals reviewed.    ED Treatments / Results  Labs (all labs ordered are listed, but only abnormal results are displayed) Labs Reviewed - No data to display  EKG  EKG Interpretation None       Radiology Dg Shoulder Left  Result Date: 08/11/2016 CLINICAL DATA:  Anterior shoulder pain EXAM: LEFT SHOULDER - 2+ VIEW COMPARISON:  None. FINDINGS: No acute displaced fracture or dislocation. Ovoid calcification adjacent to the humeral head. Left upper lung is clear. IMPRESSION: 1. No acute fracture or malalignment 2. Soft tissue calcification adjacent to the humeral head, likely tendinous in etiology. Electronically Signed   By: Donavan Foil M.D.   On: 08/11/2016 01:50    Procedures Procedures (including critical care time)  Medications Ordered in ED Medications - No data to display   Initial Impression / Assessment and Plan / ED Course  I have reviewed the triage vital signs and the nursing notes.  Pertinent labs & imaging results that were available during my care of the patient were reviewed by me and considered in my medical decision making (see chart for details).  Clinical Course    Patient presents with left shoulder pain that started 4 days ago after she caught herself with her left arm to prevent from falling after tripping on her cat. Reports pain is worse with movement. Endorses associated weakness. VSS. Exam revealed tenderness over left shoulder with decreased  range of motion and weakness due to reported pain. Left upper extremity neurovascularly intact. Left shoulder x-ray showed no acute fracture. Plan to discharge patient home with symptomatic treatment. Patient given information to follow-up with orthopedics or pain has not improved over the next week for possible rotator cuff injury. Discussed return precautions.  Final Clinical Impressions(s) / ED Diagnoses   Final diagnoses:  Acute pain of left shoulder    New Prescriptions New Prescriptions   IBUPROFEN (ADVIL,MOTRIN) 600 MG TABLET    Take 1 tablet (600 mg total) by mouth every 6 (six) hours as needed.     Chesley Noon [place], [place] 08/11/16 0424    Everlene Balls, MD 08/11/16 [phone]

## 2017-02-02 ENCOUNTER — Other Ambulatory Visit: Payer: Self-pay | Admitting: Family Medicine

## 2017-02-02 DIAGNOSIS — N63 Unspecified lump in unspecified breast: Secondary | ICD-10-CM

## 2017-02-03 ENCOUNTER — Other Ambulatory Visit: Payer: Self-pay | Admitting: Family Medicine

## 2017-02-03 DIAGNOSIS — R911 Solitary pulmonary nodule: Secondary | ICD-10-CM

## 2017-02-09 ENCOUNTER — Other Ambulatory Visit: Payer: Managed Care, Other (non HMO)

## 2017-02-10 ENCOUNTER — Other Ambulatory Visit: Payer: Managed Care, Other (non HMO)

## 2017-02-15 ENCOUNTER — Other Ambulatory Visit: Payer: Managed Care, Other (non HMO)

## 2017-02-16 ENCOUNTER — Other Ambulatory Visit: Payer: Self-pay | Admitting: Family Medicine

## 2017-02-16 ENCOUNTER — Ambulatory Visit
Admission: RE | Admit: 2017-02-16 | Discharge: 2017-02-16 | Disposition: A | Discharge status: Home / Self Care | Payer: Managed Care, Other (non HMO) | Source: Ambulatory Visit | Attending: Family Medicine | Admitting: Family Medicine

## 2017-02-16 DIAGNOSIS — N63 Unspecified lump in unspecified breast: Secondary | ICD-10-CM

## 2017-02-20 ENCOUNTER — Ambulatory Visit
Admission: RE | Admit: 2017-02-20 | Discharge: 2017-02-20 | Disposition: A | Discharge status: Home / Self Care | Payer: Managed Care, Other (non HMO) | Source: Ambulatory Visit | Attending: Family Medicine | Admitting: Family Medicine

## 2017-02-20 DIAGNOSIS — R911 Solitary pulmonary nodule: Secondary | ICD-10-CM

## 2017-03-15 IMAGING — CT CT ABD-PELV W/ CM
1 of 3 series · 14 of 32 positions shown, 19 images · IV contrast (OMNIPAQUE 300)
Comparison: Lumbar spine MRI 06/18/2013. No prior abdominal or
pelvic cross-sectional imaging for comparison.

CLINICAL DATA: Epigastric abdominal pain

EXAM:
CT ABDOMEN AND PELVIS WITH CONTRAST
TECHNIQUE: Multidetector CT imaging of the abdomen and pelvis was performed
using the standard protocol following bolus administration of
intravenous contrast.
CONTRAST:  50mL OMNIPAQUE IOHEXOL 300 MG/ML SOLN, 100mL OMNIPAQUE
IOHEXOL 300 MG/ML SOLN

[Series 2: abd/pel with · axial · 0.74mm/px · z∈[-421,-41]mm · 14 of 86 slices shown, 19 images]
[im 5/86  soft-tissue]
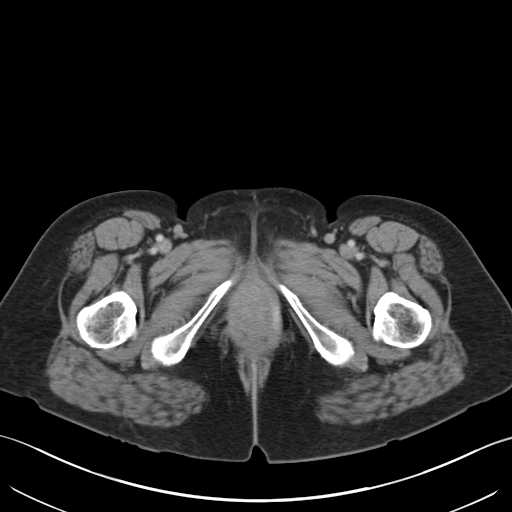
[im 5/86  bone]
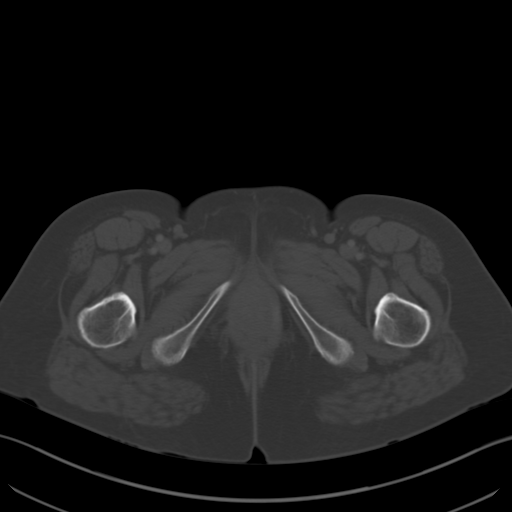
[im 10/86  soft-tissue]
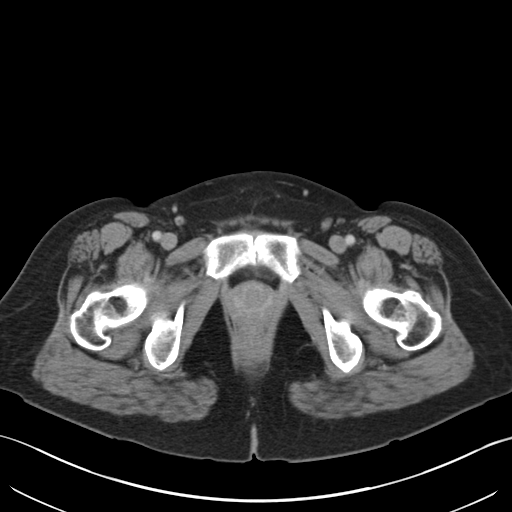
[im 19/86  soft-tissue]
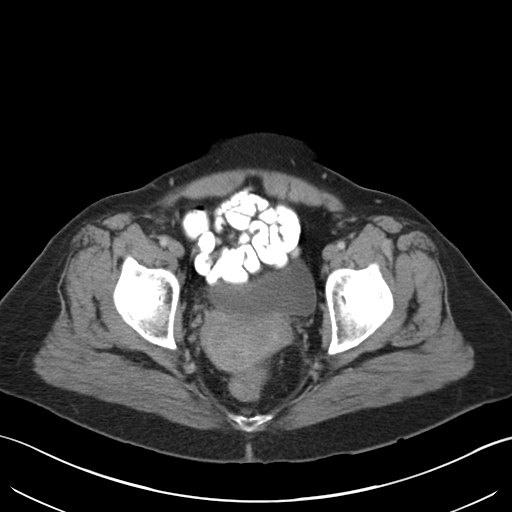
[im 24/86  soft-tissue]
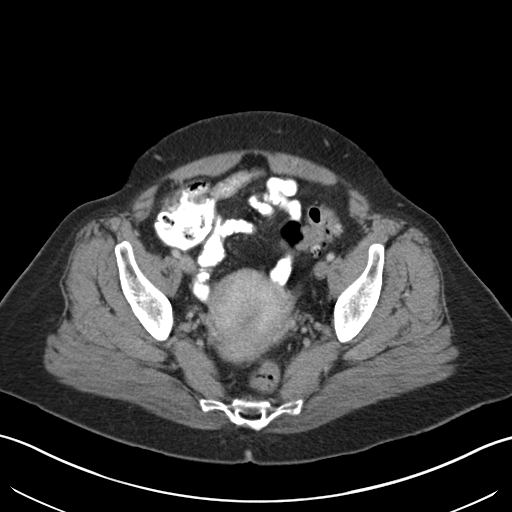
[im 29/86  soft-tissue]
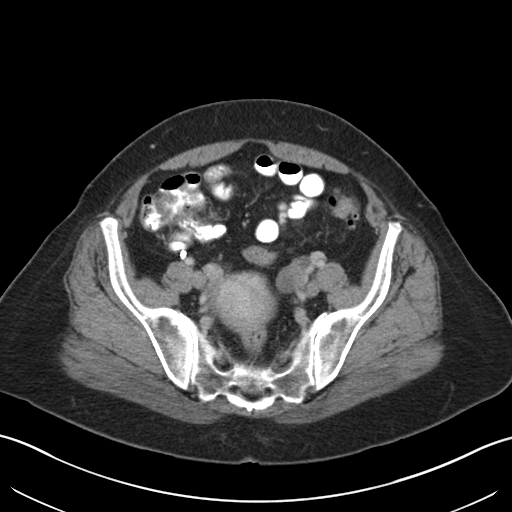
[im 38/86  soft-tissue]
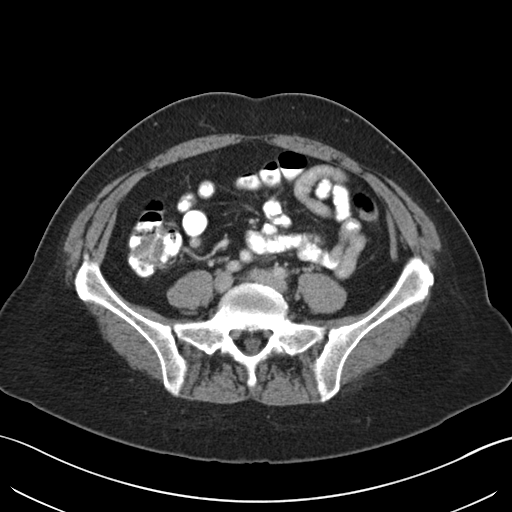
[im 43/86  soft-tissue]
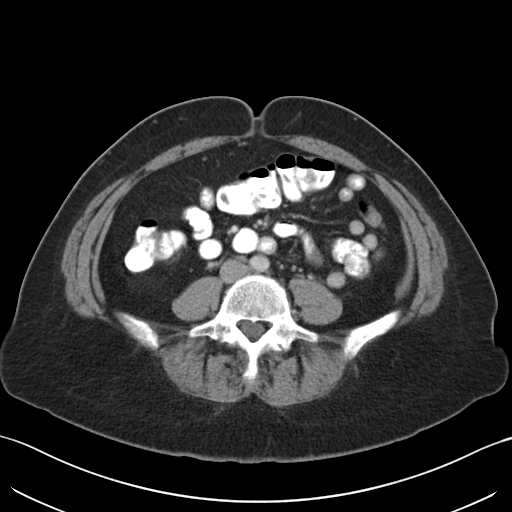
[im 48/86  soft-tissue]
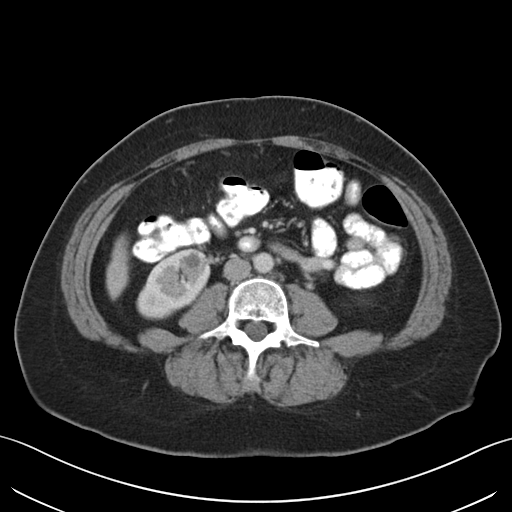
[im 57/86  soft-tissue]
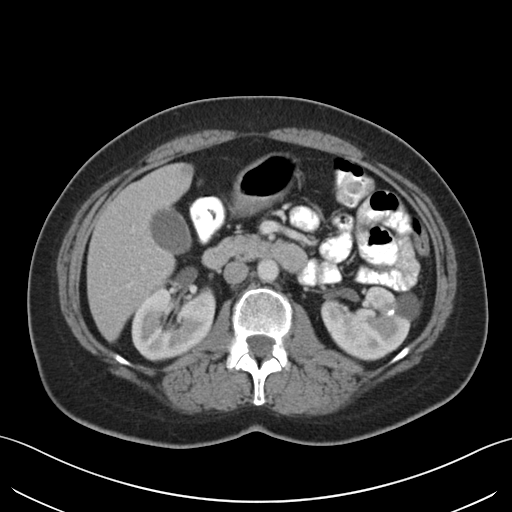
[im 57/86  bone]
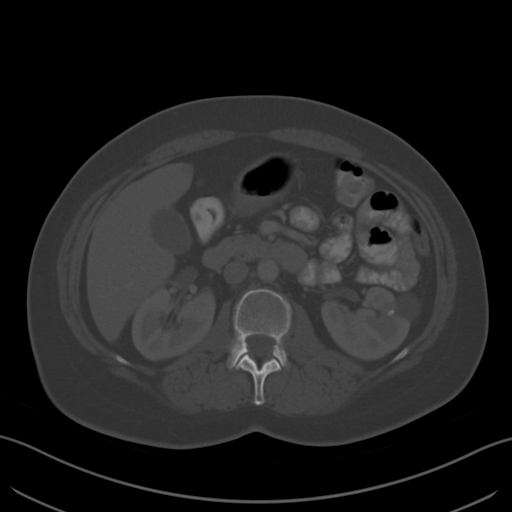
[im 62/86  soft-tissue]
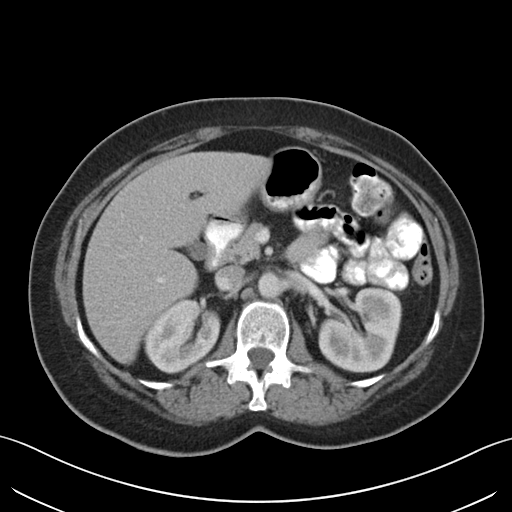
[im 67/86  soft-tissue]
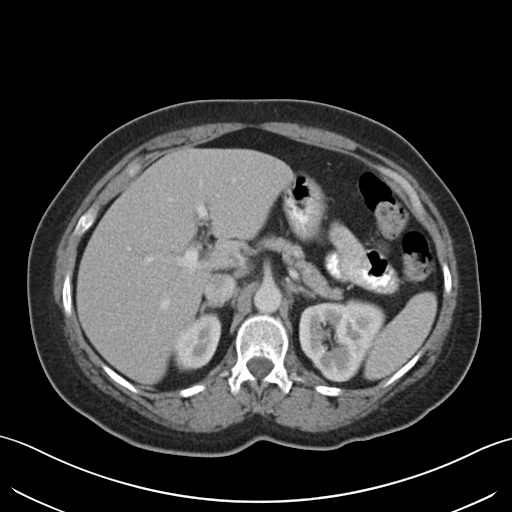
[im 67/86  lung]
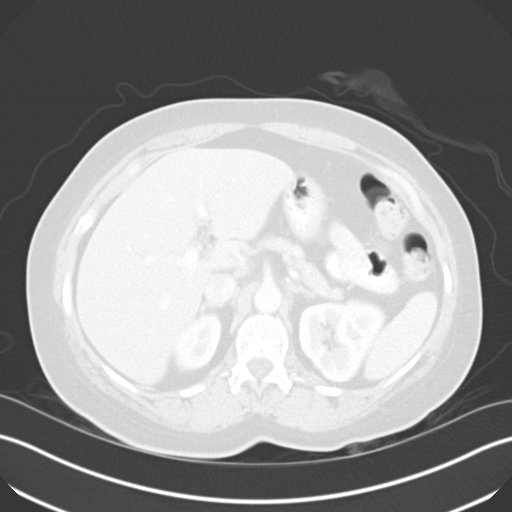
[im 71/86  lung]
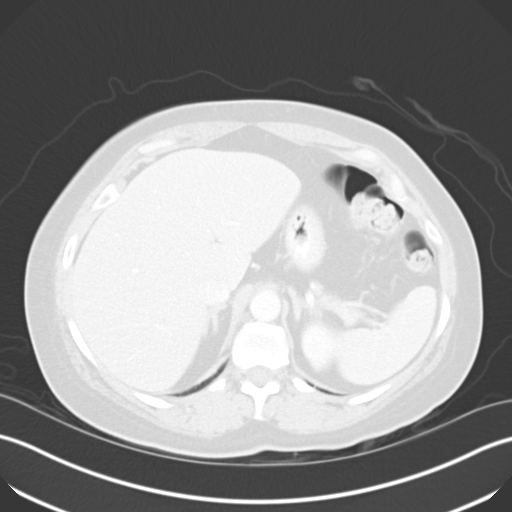
[im 76/86  soft-tissue]
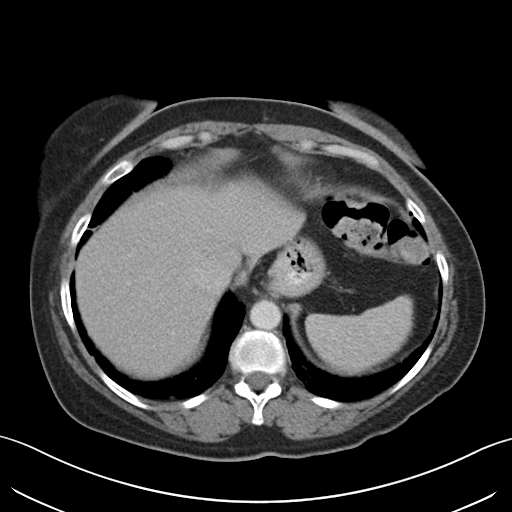
[im 76/86  lung]
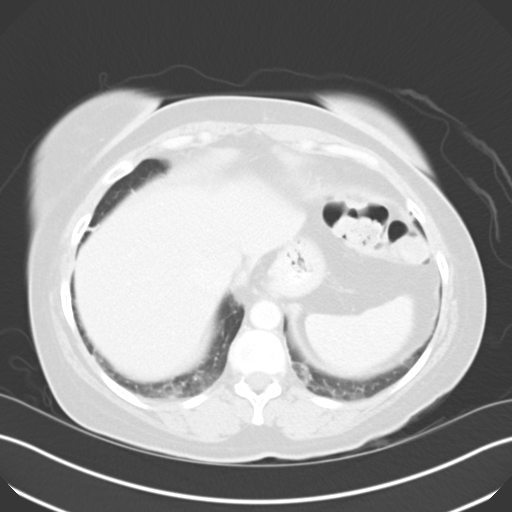
[im 81/86  soft-tissue]
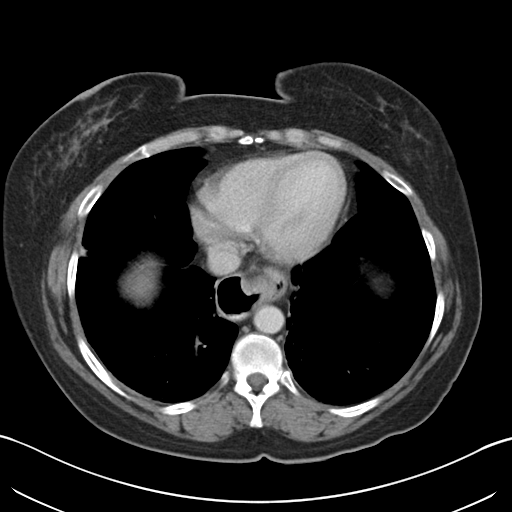
[im 81/86  lung]
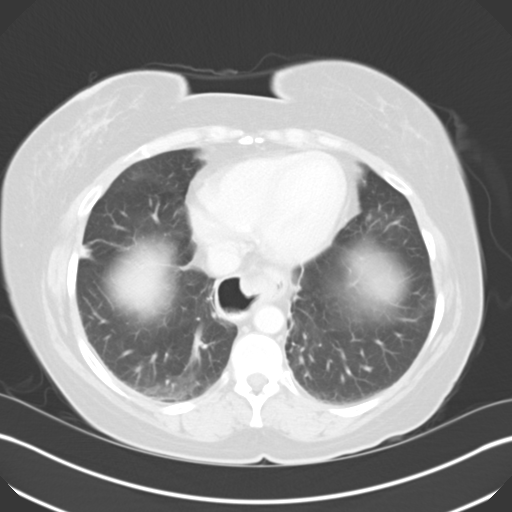

[14 of 32 positions shown; findings below may reference images not displayed]

FINDINGS: Lower chest: Minimal dependent bibasilar atelectasis is present.
Small hiatal hernia noted with epigastric clips in place.

Hepatobiliary: Probable focal fat medial segment left hepatic lobe
image 29 adjacent to the falciform ligament. Liver is otherwise
unremarkable. Gallbladder is normal.

Pancreas: Normal

Spleen: Normal

Adrenals/Urinary Tract: Left upper pole 2.3 cm cortical cyst
adjacent to nonobstructing 6 mm calculus identified image 29. No
hydroureteronephrosis. Adrenal glands are normal. Right mid renal
cortical 0.9 cm cyst identified image 34. Right lower renal pole
cortical cyst measures 1.0 cm image 39.

Stomach/Bowel: Colonic diverticuli noted without evidence for
diverticulitis. Small bowel and stomach are unremarkable aside from
hiatal hernia described above.

Vascular/Lymphatic: Minimal atheromatous aortic calcifications
without aneurysm. No lymphadenopathy.

Other: No free air. Uterus and ovaries appear normal. Midline
ventral abdominal wall fat containing hernia identified image 36.

Musculoskeletal: No acute osseous abnormality.
IMPRESSION: No acute intra-abdominal or pelvic pathology.

Small hiatal hernia.

## 2017-08-21 ENCOUNTER — Other Ambulatory Visit: Payer: Managed Care, Other (non HMO)

## 2019-01-11 ENCOUNTER — Other Ambulatory Visit: Payer: Self-pay | Admitting: Family Medicine

## 2019-01-11 DIAGNOSIS — N6310 Unspecified lump in the right breast, unspecified quadrant: Secondary | ICD-10-CM

## 2019-01-11 DIAGNOSIS — N644 Mastodynia: Secondary | ICD-10-CM

## 2019-01-15 ENCOUNTER — Other Ambulatory Visit: Payer: Self-pay | Admitting: Family Medicine

## 2019-01-30 ENCOUNTER — Other Ambulatory Visit: Payer: Self-pay | Admitting: Family Medicine

## 2019-01-30 DIAGNOSIS — J439 Emphysema, unspecified: Secondary | ICD-10-CM

## 2019-01-30 DIAGNOSIS — R911 Solitary pulmonary nodule: Secondary | ICD-10-CM

## 2019-10-20 ENCOUNTER — Other Ambulatory Visit: Payer: Self-pay

## 2019-10-20 ENCOUNTER — Emergency Department (HOSPITAL_COMMUNITY)
Admission: EM | Admit: 2019-10-20 | Discharge: 2019-10-20 | Disposition: A | Discharge status: Home / Self Care | Payer: Self-pay | Attending: Emergency Medicine | Admitting: Emergency Medicine

## 2019-10-20 ENCOUNTER — Encounter (HOSPITAL_COMMUNITY): Payer: Self-pay | Admitting: *Deleted

## 2019-10-20 DIAGNOSIS — Z5321 Procedure and treatment not carried out due to patient leaving prior to being seen by health care provider: Secondary | ICD-10-CM | POA: Insufficient documentation

## 2019-10-20 DIAGNOSIS — Y929 Unspecified place or not applicable: Secondary | ICD-10-CM | POA: Insufficient documentation

## 2019-10-20 DIAGNOSIS — Y999 Unspecified external cause status: Secondary | ICD-10-CM | POA: Insufficient documentation

## 2019-10-20 DIAGNOSIS — R2231 Localized swelling, mass and lump, right upper limb: Secondary | ICD-10-CM | POA: Insufficient documentation

## 2019-10-20 DIAGNOSIS — Y9389 Activity, other specified: Secondary | ICD-10-CM | POA: Insufficient documentation

## 2019-10-20 DIAGNOSIS — S6991XA Unspecified injury of right wrist, hand and finger(s), initial encounter: Secondary | ICD-10-CM | POA: Insufficient documentation

## 2019-10-20 DIAGNOSIS — W540XXA Bitten by dog, initial encounter: Secondary | ICD-10-CM | POA: Insufficient documentation

## 2019-10-20 NOTE — ED Triage Notes (Signed)
A stray dog got caught in pt's fence yesterday around dusk.  As the pt was attempting to help the dog, the dog bit the pt's right hand.  Pt cleaned the wound last night but did not seek medical attention.  Pt presents with a swollen right hand.  Pt a/o x 4 and ambulatory.

## 2019-10-21 ENCOUNTER — Emergency Department (HOSPITAL_COMMUNITY)
Admission: EM | Admit: 2019-10-21 | Discharge: 2019-10-21 | Disposition: A | Discharge status: Home / Self Care | Payer: Managed Care, Other (non HMO) | Attending: Emergency Medicine | Admitting: Emergency Medicine

## 2019-10-21 NOTE — ED Notes (Signed)
Pt called for room with no answer. 

## 2019-10-21 NOTE — ED Notes (Signed)
Pt called to update vital signs with no answer.

## 2019-10-28 ENCOUNTER — Encounter (HOSPITAL_COMMUNITY): Payer: Self-pay

## 2019-10-28 ENCOUNTER — Other Ambulatory Visit: Payer: Self-pay

## 2019-10-28 DIAGNOSIS — Z832 Family history of diseases of the blood and blood-forming organs and certain disorders involving the immune mechanism: Secondary | ICD-10-CM

## 2019-10-28 DIAGNOSIS — W540XXA Bitten by dog, initial encounter: Secondary | ICD-10-CM

## 2019-10-28 DIAGNOSIS — Z803 Family history of malignant neoplasm of breast: Secondary | ICD-10-CM

## 2019-10-28 DIAGNOSIS — Z5329 Procedure and treatment not carried out because of patient's decision for other reasons: Secondary | ICD-10-CM | POA: Diagnosis not present

## 2019-10-28 DIAGNOSIS — Z23 Encounter for immunization: Secondary | ICD-10-CM

## 2019-10-28 DIAGNOSIS — F17210 Nicotine dependence, cigarettes, uncomplicated: Secondary | ICD-10-CM | POA: Diagnosis present

## 2019-10-28 DIAGNOSIS — M797 Fibromyalgia: Secondary | ICD-10-CM | POA: Diagnosis present

## 2019-10-28 DIAGNOSIS — L02511 Cutaneous abscess of right hand: Principal | ICD-10-CM | POA: Diagnosis present

## 2019-10-28 DIAGNOSIS — S61431A Puncture wound without foreign body of right hand, initial encounter: Secondary | ICD-10-CM | POA: Diagnosis present

## 2019-10-28 DIAGNOSIS — Z9851 Tubal ligation status: Secondary | ICD-10-CM

## 2019-10-28 DIAGNOSIS — Z8349 Family history of other endocrine, nutritional and metabolic diseases: Secondary | ICD-10-CM

## 2019-10-28 DIAGNOSIS — Z9049 Acquired absence of other specified parts of digestive tract: Secondary | ICD-10-CM

## 2019-10-28 DIAGNOSIS — L03113 Cellulitis of right upper limb: Secondary | ICD-10-CM | POA: Diagnosis present

## 2019-10-28 DIAGNOSIS — M5136 Other intervertebral disc degeneration, lumbar region: Secondary | ICD-10-CM | POA: Diagnosis present

## 2019-10-28 DIAGNOSIS — Z79899 Other long term (current) drug therapy: Secondary | ICD-10-CM

## 2019-10-28 DIAGNOSIS — Z20822 Contact with and (suspected) exposure to covid-19: Secondary | ICD-10-CM | POA: Diagnosis present

## 2019-10-28 NOTE — ED Triage Notes (Signed)
Pt reports getting bit on right hand by a stray dog on 10/20/19. Pt reports she came to be seen the next day but had to leave. Pt states the swelling went down the next day so she thought it was fine. Pts hand is now visibly swollen and has a few bite marks. Pt unsure about rabies vaccine information on the dog.

## 2019-10-28 NOTE — ED Notes (Signed)
Patient given ice-pack for swelling and pain

## 2019-10-29 ENCOUNTER — Encounter (HOSPITAL_COMMUNITY): Payer: Self-pay

## 2019-10-29 ENCOUNTER — Emergency Department (HOSPITAL_COMMUNITY): Payer: Self-pay

## 2019-10-29 ENCOUNTER — Inpatient Hospital Stay (HOSPITAL_COMMUNITY)
Admission: EM | Admit: 2019-10-29 | Discharge: 2019-10-29 | DRG: 580 | Discharge status: Against Medical Advice | Payer: Self-pay | Attending: Internal Medicine | Admitting: Internal Medicine

## 2019-10-29 DIAGNOSIS — L0291 Cutaneous abscess, unspecified: Secondary | ICD-10-CM

## 2019-10-29 DIAGNOSIS — W540XXA Bitten by dog, initial encounter: Secondary | ICD-10-CM | POA: Diagnosis present

## 2019-10-29 DIAGNOSIS — S61451A Open bite of right hand, initial encounter: Secondary | ICD-10-CM | POA: Diagnosis present

## 2019-10-29 LAB — CBC WITH DIFFERENTIAL/PLATELET
Abs Immature Granulocytes: 0.1 10*3/uL — ABNORMAL HIGH (ref 0.00–0.07)
Basophils Absolute: 0.1 10*3/uL (ref 0.0–0.1)
Basophils Relative: 0 %
Eosinophils Absolute: 0 10*3/uL (ref 0.0–0.5)
Eosinophils Relative: 0 %
HCT: 43.2 % (ref 36.0–46.0)
Hemoglobin: 13.1 g/dL (ref 12.0–15.0)
Immature Granulocytes: 1 %
Lymphocytes Relative: 11 %
Lymphs Abs: 2.3 10*3/uL (ref 0.7–4.0)
MCH: 26.7 pg (ref 26.0–34.0)
MCHC: 30.3 g/dL (ref 30.0–36.0)
MCV: 88.2 fL (ref 80.0–100.0)
Monocytes Absolute: 1.8 10*3/uL — ABNORMAL HIGH (ref 0.1–1.0)
Monocytes Relative: 9 %
Neutro Abs: 15.9 10*3/uL — ABNORMAL HIGH (ref 1.7–7.7)
Neutrophils Relative %: 79 %
Platelets: 370 10*3/uL (ref 150–400)
RBC: 4.9 MIL/uL (ref 3.87–5.11)
RDW: 14.6 % (ref 11.5–15.5)
WBC: 20.2 10*3/uL — ABNORMAL HIGH (ref 4.0–10.5)
nRBC: 0 % (ref 0.0–0.2)

## 2019-10-29 LAB — BASIC METABOLIC PANEL
Anion gap: 10 (ref 5–15)
BUN: 9 mg/dL (ref 6–20)
CO2: 26 mmol/L (ref 22–32)
Calcium: 9.2 mg/dL (ref 8.9–10.3)
Chloride: 103 mmol/L (ref 98–111)
Creatinine, Ser: 0.58 mg/dL (ref 0.44–1.00)
GFR calc Af Amer: >60 mL/min (ref >60)
GFR calc non Af Amer: >60 mL/min (ref >60)
Glucose, Bld: 128 mg/dL — ABNORMAL HIGH (ref 70–99)
Potassium: 3.3 mmol/L — ABNORMAL LOW (ref 3.5–5.1)
Sodium: 139 mmol/L (ref 135–145)

## 2019-10-29 LAB — SARS CORONAVIRUS 2 (TAT 6-24 HRS): SARS Coronavirus 2: NEGATIVE

## 2019-10-29 MED ORDER — SULFAMETHOXAZOLE-TRIMETHOPRIM 800-160 MG PO TABS: 1.0000 | ORAL_TABLET | Freq: Two times a day (BID) | ORAL | 0 refills | Status: AC

## 2019-10-29 MED ORDER — HYDROCODONE-ACETAMINOPHEN 5-325 MG PO TABS: 1.0000 | ORAL_TABLET | Freq: Four times a day (QID) | ORAL | 0 refills | Status: DC | PRN

## 2019-10-29 MED ORDER — SODIUM CHLORIDE 0.9 % IV SOLN
1.5000 g | Freq: Four times a day (QID) | INTRAVENOUS | Status: DC
  Administered 2019-10-29: 05:00:00 1.5 g via INTRAVENOUS
  Filled 2019-10-29 (×2): qty 4

## 2019-10-29 MED ORDER — TETANUS-DIPHTH-ACELL PERTUSSIS 5-2.5-18.5 LF-MCG/0.5 IM SUSP
0.5000 mL | Freq: Once | INTRAMUSCULAR | Status: AC
  Administered 2019-10-29: 04:00:00 0.5 mL via INTRAMUSCULAR
  Filled 2019-10-29: qty 0.5

## 2019-10-29 MED ORDER — RABIES IMMUNE GLOBULIN 150 UNIT/ML IM INJ
20.0000 [IU]/kg | INJECTION | Freq: Once | INTRAMUSCULAR | Status: AC
  Administered 2019-10-29: 04:00:00 1425 [IU] via INTRAMUSCULAR
  Filled 2019-10-29: qty 9.5

## 2019-10-29 MED ORDER — LIDOCAINE-EPINEPHRINE (PF) 2 %-1:200000 IJ SOLN
10.0000 mL | Freq: Once | INTRAMUSCULAR | Status: AC
  Administered 2019-10-29: 10 mL
  Filled 2019-10-29: qty 10

## 2019-10-29 MED ORDER — IOHEXOL 300 MG/ML  SOLN
100.0000 mL | Freq: Once | INTRAMUSCULAR | Status: AC | PRN
  Administered 2019-10-29: 05:00:00 100 mL via INTRAVENOUS

## 2019-10-29 MED ORDER — RABIES VACCINE, PCEC IM SUSR
1.0000 mL | Freq: Once | INTRAMUSCULAR | Status: AC
  Administered 2019-10-29: 04:00:00 1 mL via INTRAMUSCULAR
  Filled 2019-10-29: qty 1

## 2019-10-29 MED ORDER — AMOXICILLIN-POT CLAVULANATE 875-125 MG PO TABS: 1.0000 | ORAL_TABLET | Freq: Two times a day (BID) | ORAL | 0 refills | Status: AC

## 2019-10-29 MED ORDER — SODIUM CHLORIDE (PF) 0.9 % IJ SOLN
INTRAMUSCULAR | Status: AC
  Filled 2019-10-29: qty 50

## 2019-10-29 NOTE — ED Notes (Addendum)
Discharge paperwork reviewed with pt, including prescriptions.  Pt verbalized understanding, ambulatory at discharge. No questions or concerns stated by pt at this time.

## 2019-10-29 NOTE — Progress Notes (Addendum)
Was preparing to go to [facility] and evaluate early clinical response following bedside I&D and IV antibiotics.  I have discovered that this patient was never actually admitted, but instead refused parenteral antibiotics in the hospital where her response to treatment could also be monitored and instead left the hospital this AM against my medical advice after her bedside I&D was performed.    This refusal of appropriate care was ill-advised and could possibly result in worsening of her infection, with permanent loss of her limb, part of her limb, and/or limb function.  Accordingly, she is considered discharged from my care for non-compliance.  I called the patient on her mobile phone to apprise her of these risks and encourage her to return to the hospital for further appropriate inpatient care, but she did not answer.  Voicemail box was not set up to accept messages.   Micheline Rough, MD [facility] [phone]

## 2019-10-29 NOTE — ED Notes (Signed)
I&D tray at bedside for Kelsey West, [place]

## 2019-10-29 NOTE — ED Notes (Signed)
Pt given new bag of ice.

## 2019-10-29 NOTE — ED Notes (Signed)
Pt stated she would like to wait for covid swab until confirmed admission

## 2019-10-29 NOTE — ED Provider Notes (Signed)
[facility] DEPT Provider Note   CSN: [id number] Arrival date & time: 10/28/19  1905     History Chief Complaint  Patient presents with  . Animal Bite    Kelsey West is a 49 y.o. female.  Patient presents to the emergency department with a chief complaint of right hand pain and swelling.  She is right-hand dominant.  She states that she was bitten by a stray dog approximately 1 week ago.  She initially had some swelling, but then it subsided.  She states that over the past day or so, the swelling has significantly worsened.  She has also had some redness extending up her right arm.  She denies any fever.  She does report pain.  She is unable to make a fist because of the swelling and pain.  She is able to flex and extend her wrist.  The dog was a stray.  No immunization record available.  The history is provided by the patient. No language interpreter was used.       Past Medical History:  Diagnosis Date  . Arthritis   . Chronic back pain   . Degenerative disc disease, lumbar   . Fibromyalgia 01/2014  . Incisional hernia   . Smoker     Patient Active Problem List   Diagnosis Date Noted  . Fibromyalgia syndrome 02/11/2014  . Leukocytosis, unspecified 11/26/2013  . Encounter for Pap smear 06/10/2013  . Chronic back pain 05/16/2013  . Sciatica of right side 05/16/2013    Past Surgical History:  Procedure Laterality Date  . APPENDECTOMY    . HERNIA REPAIR    . HIATAL HERNIA REPAIR  2001  . TUBAL LIGATION    . TUMOR REMOVAL     from right pinky finger     OB History   No obstetric history on file.     Family History  Problem Relation Age of Onset  . Thyroid disease Mother   . Other Brother        Polyarteritis nodosa  . Thyroid disease Paternal Grandmother   . Cancer Paternal Grandmother   . Breast cancer Paternal Uncle     Social History   Tobacco Use  . Smoking status: Current Every Day Smoker    Packs/day: 0.50     Years: 30.00    Pack years: 15.00    Types: Cigarettes  . Smokeless tobacco: Never Used  Substance Use Topics  . Alcohol use: No    Comment: one beer every 6 months  . Drug use: No    Home Medications Prior to Admission medications   Medication Sig Start Date End Date Taking? Authorizing Provider  cephALEXin (KEFLEX) 500 MG capsule Take 1 capsule (500 mg total) by mouth 4 (four) times daily. 02/22/15   Waynetta Pean, PA-C  gabapentin (NEURONTIN) 300 MG capsule Take 1 capsule (300 mg total) by mouth 4 (four) times daily. 04/28/14   Kirsteins, Luanna Salk, MD  hydrocortisone cream 0.5 % Apply topically 2 (two) times daily. 06/10/13   Tresa Garter, MD  ibuprofen (ADVIL,MOTRIN) 600 MG tablet Take 1 tablet (600 mg total) by mouth every 6 (six) hours as needed. 08/11/16   Nona Dell, PA-C  naproxen (NAPROSYN) 500 MG tablet Take 1 tablet (500 mg total) by mouth 2 (two) times daily with a meal. 02/22/15   Waynetta Pean, PA-C  omeprazole (PRILOSEC) 20 MG capsule Take 1 capsule (20 mg total) by mouth daily. 02/22/15   Waynetta Pean, PA-C  potassium chloride 20 MEQ TBCR Take 40 mEq by mouth daily. Take 19meq twice daily for 2 days then once daily till finished. 05/19/15   Brunetta Genera, MD    Allergies    Patient has no known allergies.  Review of Systems   Review of Systems  All other systems reviewed and are negative.   Physical Exam Updated Vital Signs BP (!) 153/77 (BP Location: Left Arm)   Pulse 89   Temp 98.7 F (37.1 C) (Oral)   Resp 17   SpO2 95%   Physical Exam Vitals and nursing note reviewed.  Constitutional:      General: She is not in acute distress.    Appearance: She is well-developed.  HENT:     Head: Normocephalic and atraumatic.  Eyes:     Conjunctiva/sclera: Conjunctivae normal.  Cardiovascular:     Rate and Rhythm: Normal rate and regular rhythm.     Heart sounds: No murmur.  Pulmonary:     Effort: Pulmonary effort is normal. No  respiratory distress.     Breath sounds: Normal breath sounds.  Abdominal:     Palpations: Abdomen is soft.     Tenderness: There is no abdominal tenderness.  Musculoskeletal:        General: Normal range of motion.     Cervical back: Neck supple.     Comments: Right hand as pictured, significant swelling, small puncture wound to the posterior aspect, no fluctuance, unable to make a fist, range of motion of the wrist is 5/5  Skin:    General: Skin is warm and dry.     Comments: Erythema extending from the hand to the mid forearm  Neurological:     Mental Status: She is alert and oriented to person, place, and time.  Psychiatric:        Mood and Affect: Mood normal.        Behavior: Behavior normal.         ED Results / Procedures / Treatments   Labs (all labs ordered are listed, but only abnormal results are displayed) Labs Reviewed  CBC WITH DIFFERENTIAL/PLATELET - Abnormal; Notable for the following components:      Result Value   WBC 20.2 (*)    Neutro Abs 15.9 (*)    Monocytes Absolute 1.8 (*)    Abs Immature Granulocytes 0.10 (*)    All other components within normal limits  BASIC METABOLIC PANEL - Abnormal; Notable for the following components:   Potassium 3.3 (*)    Glucose, Bld 128 (*)    All other components within normal limits  SARS CORONAVIRUS 2 (TAT 6-24 HRS)    EKG None  Radiology CT Extrem Up Entire Arm R W/CM  Result Date: 10/29/2019 CLINICAL DATA:  Recent dog bite now with swelling EXAM: CT OF THE UPPER RIGHT EXTREMITY WITH CONTRAST TECHNIQUE: Multidetector CT imaging of the upper right extremity was performed according to the standard protocol following intravenous contrast administration. COMPARISON:  None. CONTRAST:  153mL OMNIPAQUE IOHEXOL 300 MG/ML  SOLN FINDINGS: Bones/Joint/Cartilage No fracture or dislocation. Normal bone mineralization seen throughout. The joint spaces appear to be well maintained. No elbow or wrist joint effusion. Ligaments  Suboptimally assessed by CT. Muscles and Tendons The muscles surrounding the forearm are normal appearance without evidence of focal atrophy or tear. The level of the dorsal mid third metacarpal there is a multilocular peripherally enhancing fluid collection which surrounds the third extensor tendon measuring 1.8 x 1.5 by 2.7 cm. The  collection abuts the surface of the third metacarpal. Soft tissues There is diffuse dorsal subcutaneous edema and skin thickening. The subcutaneous edema extends to the level of the proximal forearm. IMPRESSION: Peripherally enhancing cystic collection on the dorsum of the hand at the level of the third metacarpal surrounding the extensor digitorum tendon measuring 1.8 x 1.5 x 2.7 cm, likely abscess. Diffuse dorsal subcutaneous edema and skin thickening, consistent with cellulitis. Electronically Signed   By: Prudencio Pair M.D.   On: 10/29/2019 05:39    Procedures .Marland KitchenIncision and Drainage  Date/Time: 10/29/2019 6:41 AM Performed by: Montine Circle, PA-C Authorized by: Montine Circle, PA-C   Consent:    Consent obtained:  Verbal   Consent given by:  Patient   Risks discussed:  Bleeding, incomplete drainage, pain and damage to other organs   Alternatives discussed:  No treatment Universal protocol:    Procedure explained and questions answered to patient or proxy's satisfaction: yes     Relevant documents present and verified: yes     Test results available and properly labeled: yes     Imaging studies available: yes     Required blood products, implants, devices, and special equipment available: yes     Site/side marked: yes     Immediately prior to procedure a time out was called: yes     Patient identity confirmed:  Verbally with patient Location:    Type:  Abscess   Location:  Upper extremity   Upper extremity location:  Hand   Hand location:  R hand Pre-procedure details:    Skin preparation:  Betadine Anesthesia (see MAR for exact dosages):     Anesthesia method:  Local infiltration   Local anesthetic:  Lidocaine 1% WITH epi Procedure type:    Complexity:  Complex Procedure details:    Needle aspiration: no     Incision types:  Single straight   Incision depth:  Subcutaneous   Scalpel blade:  11   Wound management:  Probed and deloculated, irrigated with saline and extensive cleaning   Drainage:  Purulent   Drainage amount:  Copious   Packing materials:  1/4 in gauze Post-procedure details:    Patient tolerance of procedure:  Tolerated well, no immediate complications   (including critical care time)  Medications Ordered in ED Medications  Tdap (BOOSTRIX) injection 0.5 mL (has no administration in time range)  rabies vaccine (RABAVERT) injection 1 mL (has no administration in time range)  rabies immune globulin (HYPERAB/KEDRAB) injection 1,425 Units (has no administration in time range)    ED Course  I have reviewed the triage vital signs and the nursing notes.  Pertinent labs & imaging results that were available during my care of the patient were reviewed by me and considered in my medical decision making (see chart for details).    MDM Rules/Calculators/A&P                      Patient here with dog bite.  Has significant right hand swelling.  Significant leukocytosis to 20.2.  She is afebrile.  Blood pressures have been stable.  Does not appear toxic, but does have significant swelling and cellulitis.  Will check CT imaging to rule out drainable abscess.  Will start patient on Unasyn.  The dog was unfortunately stray, and we have no immunization records or way of monitoring the animal, therefore rabies immune globulin and vaccine will be administered.  Patient seen by and discussed with Dr. Betsey Holiday, who agrees with the  plan.  6:18 AM Case discussed with Dr. Grandville Silos, who recommends bedside I&D by EDP and admission with IV antibiotics.  Dr. Grandville Silos states he will follow.   Final Clinical Impression(s) / ED  Diagnoses Final diagnoses:  Dog bite, initial encounter  Abscess    Rx / DC Orders ED Discharge Orders    None       Montine Circle, PA-C 10/29/19 [id number]    Orpah Greek, MD 10/29/19 [phone]

## 2019-10-29 NOTE — ED Notes (Signed)
Pt requesting to leave d/t not having anyone to take care of her child.  EDP Pollina, EDP Sofia made aware. Admitting Hospitalist paged.

## 2019-10-29 NOTE — ED Provider Notes (Signed)
Informed by RN that patient is requesting to leave even though she has been admitted due to childcare issues.  I discussed with patient risks of leaving including worsening condition up to and including death.  Discussed importance of taking antibiotics as prescribed.  Will give short course of pain control, PMP checked without concerning narcotic use.  Discussed prompt return to the emergency room with any worsening symptoms. Admitting team made aware pt is leaving.    Franchot Heidelberg, PA-C 10/29/19 0749    Hayden Rasmussen, MD 10/29/19 1735

## 2019-10-29 NOTE — Progress Notes (Addendum)
Called by ED PA Rob re: patient with 1 inch dorsal SQ abscess of right hand from stray dog bite 10-20-19.  I have reviewed the clinical photos and the CT scan, which reveals dorsal SQ abscess and wide-spread SQ edema, no deep (sub-fascial) process, no septic arthritis or other deep-space involvement. Patient afebrile, WBC 20.2. The abscess itself abuts the skin, but is amenable to bedside drainage and irrigation.  I reinforced concepts of breaking up loculation, making explosure longitudinal and at least 1 inch long, leaving packing, etc.  Obviously, after the ED performs this small procedure at bedside, this patient will require admission for IV antibiotics to ensure resolution of this spreading infection.  Although this patient's care does not presently require a surgeon's involvement, I was consulted for advice, which was provided.  I remain available for more extensive surgical debridement in the OR under general anesthesia should clinical improvement not begin over next 24 hours.  In such instance, call me instead of whomever is on hand-call that day.  I would also recommend against chemical VTE prophylaxis while awaiting clinical response in case surgery becomes necessary, opting instead for SCDs.  Wound packing should be pulled tonight and hydrotherapy begun then.  I have gone ahead and provided these inpatient orders as I anticipate the EDP would not do such.  Micheline Rough, MD [facility] [phone]

## 2019-10-29 NOTE — Discharge Instructions (Addendum)
It is very important that you take antibiotics as prescribed.  Take the entire course, even if your symptoms improve. Use Tylenol and ibuprofen as needed for mild to moderate pain.  Use Norco as needed for severe breakthrough pain.  Have caution, this make you tired or groggy.  Do not drive or operate heavy machinery while taking this medicine. Understand that you are leaving the [facility].  We recommended you stay for IV antibiotics.  This could result in worsening condition, worsening infection, requiring hand surgery, and up to and including death. Return to the emergency room immediately with any worsening symptoms such as fevers, confusion, worsening redness and swelling, numbness in your hand, any new, worsening, or concerning symptoms.   You will need to have follow-up rabies vaccinations on: 1/15 1/19 1/26 You can come back to the emergency department to get these vaccines.

## 2021-07-30 ENCOUNTER — Emergency Department (HOSPITAL_COMMUNITY)
Admission: EM | Admit: 2021-07-30 | Discharge: 2021-07-30 | Disposition: A | Discharge status: Home / Self Care | Payer: Self-pay | Attending: Emergency Medicine | Admitting: Emergency Medicine

## 2021-07-30 ENCOUNTER — Emergency Department (HOSPITAL_COMMUNITY): Payer: Self-pay

## 2021-07-30 ENCOUNTER — Other Ambulatory Visit: Payer: Self-pay

## 2021-07-30 ENCOUNTER — Encounter (HOSPITAL_COMMUNITY): Payer: Self-pay

## 2021-07-30 DIAGNOSIS — F17210 Nicotine dependence, cigarettes, uncomplicated: Secondary | ICD-10-CM | POA: Insufficient documentation

## 2021-07-30 DIAGNOSIS — M545 Low back pain, unspecified: Secondary | ICD-10-CM

## 2021-07-30 DIAGNOSIS — M546 Pain in thoracic spine: Secondary | ICD-10-CM | POA: Insufficient documentation

## 2021-07-30 DIAGNOSIS — R109 Unspecified abdominal pain: Secondary | ICD-10-CM | POA: Insufficient documentation

## 2021-07-30 LAB — URINALYSIS, ROUTINE W REFLEX MICROSCOPIC
Bilirubin Urine: NEGATIVE
Glucose, UA: NEGATIVE mg/dL
Hgb urine dipstick: NEGATIVE
Ketones, ur: NEGATIVE mg/dL
Leukocytes,Ua: NEGATIVE
Nitrite: NEGATIVE
Protein, ur: NEGATIVE mg/dL
Specific Gravity, Urine: 1.001 — ABNORMAL LOW (ref 1.005–1.030)
pH: 7 (ref 5.0–8.0)

## 2021-07-30 LAB — CBC WITH DIFFERENTIAL/PLATELET
Abs Immature Granulocytes: 0.05 10*3/uL (ref 0.00–0.07)
Basophils Absolute: 0.1 10*3/uL (ref 0.0–0.1)
Basophils Relative: 1 %
Eosinophils Absolute: 0.1 10*3/uL (ref 0.0–0.5)
Eosinophils Relative: 1 %
HCT: 43.7 % (ref 36.0–46.0)
Hemoglobin: 14.3 g/dL (ref 12.0–15.0)
Immature Granulocytes: 0 %
Lymphocytes Relative: 15 %
Lymphs Abs: 1.8 10*3/uL (ref 0.7–4.0)
MCH: 31.4 pg (ref 26.0–34.0)
MCHC: 32.7 g/dL (ref 30.0–36.0)
MCV: 96 fL (ref 80.0–100.0)
Monocytes Absolute: 0.9 10*3/uL (ref 0.1–1.0)
Monocytes Relative: 7 %
Neutro Abs: 8.9 10*3/uL — ABNORMAL HIGH (ref 1.7–7.7)
Neutrophils Relative %: 76 %
Platelets: 346 10*3/uL (ref 150–400)
RBC: 4.55 MIL/uL (ref 3.87–5.11)
RDW: 11.9 % (ref 11.5–15.5)
WBC: 11.8 10*3/uL — ABNORMAL HIGH (ref 4.0–10.5)
nRBC: 0 % (ref 0.0–0.2)

## 2021-07-30 LAB — COMPREHENSIVE METABOLIC PANEL
ALT: 11 U/L (ref 0–44)
AST: 19 U/L (ref 15–41)
Albumin: 4.3 g/dL (ref 3.5–5.0)
Alkaline Phosphatase: 90 U/L (ref 38–126)
Anion gap: 8 (ref 5–15)
BUN: 8 mg/dL (ref 6–20)
CO2: 26 mmol/L (ref 22–32)
Calcium: 9.1 mg/dL (ref 8.9–10.3)
Chloride: 104 mmol/L (ref 98–111)
Creatinine, Ser: 0.66 mg/dL (ref 0.44–1.00)
GFR, Estimated: >60 mL/min (ref >60)
Glucose, Bld: 122 mg/dL — ABNORMAL HIGH (ref 70–99)
Potassium: 4 mmol/L (ref 3.5–5.1)
Sodium: 138 mmol/L (ref 135–145)
Total Bilirubin: 0.9 mg/dL (ref 0.3–1.2)
Total Protein: 8.1 g/dL (ref 6.5–8.1)

## 2021-07-30 MED ORDER — LIDOCAINE 5 % EX PTCH: 2.0000 | MEDICATED_PATCH | CUTANEOUS | 0 refills | Status: DC

## 2021-07-30 MED ORDER — NAPROXEN 500 MG PO TABS: 500.0000 mg | ORAL_TABLET | Freq: Two times a day (BID) | ORAL | 0 refills | Status: DC

## 2021-07-30 NOTE — ED Triage Notes (Signed)
Pt states she started having right flank pain yesterday. Pt states pain is worse with movement. Pt originally thought it was kidney stones, but is now concerned for musculoskeletal injury.

## 2021-07-30 NOTE — ED Provider Notes (Signed)
[facility] DEPT Provider Note   CSN: [id number] Arrival date & time: 07/30/21  1111     History Chief Complaint  Patient presents with   Back Pain    Kelsey West is a 50 y.o. female with a past medical history of hiatal hernia requiring repair presenting with flank and back pain that started yesterday.  She reports that the pain started on her right side however late last night it began to move to her spine.  Felt as though she was having another kidney stone because she has a prior history of these.  No urinary symptoms or difficulties with bowel/bowel control.  Does not feel weak.  No fevers.  Denies IVDU.  No longer gets menstrual periods.    Past Medical History:  Diagnosis Date   Arthritis    Chronic back pain    Degenerative disc disease, lumbar    Fibromyalgia 01/2014   Incisional hernia    Smoker     Patient Active Problem List   Diagnosis Date Noted   Dog bite of hand, right, initial encounter 10/29/2019   Fibromyalgia syndrome 02/11/2014   Leukocytosis, unspecified 11/26/2013   Encounter for Pap smear 06/10/2013   Chronic back pain 05/16/2013   Sciatica of right side 05/16/2013    Past Surgical History:  Procedure Laterality Date   [facility]  2001   TUBAL LIGATION     TUMOR REMOVAL     from right pinky finger     OB History   No obstetric history on file.     Family History  Problem Relation Age of Onset   Thyroid disease Mother    Other Brother        Polyarteritis nodosa   Thyroid disease Paternal Grandmother    Cancer Paternal Grandmother    Breast cancer Paternal Uncle     Social History   Tobacco Use   Smoking status: Every Day    Packs/day: 0.50    Years: 30.00    Pack years: 15.00    Types: Cigarettes   Smokeless tobacco: Never  Vaping Use   Vaping Use: Never used  Substance Use Topics   Alcohol use: No    Comment: one beer every 6 months    Drug use: No    Home Medications Prior to Admission medications   Medication Sig Start Date End Date Taking? Authorizing Provider  cephALEXin (KEFLEX) 500 MG capsule Take 1 capsule (500 mg total) by mouth 4 (four) times daily. 02/22/15   Waynetta Pean, PA-C  gabapentin (NEURONTIN) 300 MG capsule Take 1 capsule (300 mg total) by mouth 4 (four) times daily. 04/28/14   Kirsteins, Luanna Salk, MD  HYDROcodone-acetaminophen (NORCO/VICODIN) 5-325 MG tablet Take 1 tablet by mouth every 6 (six) hours as needed for severe pain. 10/29/19   Caccavale, Sophia, PA-C  hydrocortisone cream 0.5 % Apply topically 2 (two) times daily. 06/10/13   Tresa Garter, MD  ibuprofen (ADVIL,MOTRIN) 600 MG tablet Take 1 tablet (600 mg total) by mouth every 6 (six) hours as needed. 08/11/16   Nona Dell, PA-C  naproxen (NAPROSYN) 500 MG tablet Take 1 tablet (500 mg total) by mouth 2 (two) times daily with a meal. 02/22/15   Waynetta Pean, PA-C  omeprazole (PRILOSEC) 20 MG capsule Take 1 capsule (20 mg total) by mouth daily. 02/22/15   Waynetta Pean, PA-C  potassium chloride 20 MEQ TBCR Take 40  mEq by mouth daily. Take 80meq twice daily for 2 days then once daily till finished. 05/19/15   Brunetta Genera, MD    Allergies    Patient has no known allergies.  Review of Systems   Review of Systems  Constitutional:  Negative for chills and fever.  HENT:  Negative for ear pain and sore throat.   Eyes:  Negative for pain and visual disturbance.  Respiratory:  Negative for cough and shortness of breath.   Cardiovascular:  Negative for chest pain and palpitations.  Gastrointestinal:  Negative for abdominal pain and vomiting.  Genitourinary:  Negative for dysuria and hematuria.  Musculoskeletal:  Positive for back pain. Negative for arthralgias.  Skin:  Negative for color change and rash.  Neurological:  Negative for seizures and syncope.  All other systems reviewed and are negative.  Physical  Exam Updated Vital Signs BP (!) 153/87 (BP Location: Right Arm)   Pulse 76   Temp 98.5 F (36.9 C) (Oral)   Resp 16   Ht 5\' 6"  (1.676 m)   Wt 71.2 kg   LMP 09/01/2019   SpO2 97%   BMI 25.34 kg/m   Physical Exam Vitals and nursing note reviewed.  Constitutional:      Appearance: Normal appearance.  HENT:     Head: Normocephalic and atraumatic.  Eyes:     General: No scleral icterus.    Conjunctiva/sclera: Conjunctivae normal.  Cardiovascular:     Rate and Rhythm: Normal rate and regular rhythm.  Pulmonary:     Effort: Pulmonary effort is normal. No respiratory distress.     Breath sounds: Normal breath sounds. No wheezing.  Abdominal:     General: Abdomen is flat.     Palpations: Abdomen is soft.     Tenderness: There is no abdominal tenderness. There is no left CVA tenderness or guarding.  Musculoskeletal:        General: Tenderness (Some tenderness to palpation around lower thoracic spine.) present. No swelling, deformity or signs of injury.  Skin:    General: Skin is warm and dry.     Findings: No rash.  Neurological:     Mental Status: She is alert.  Psychiatric:        Mood and Affect: Mood normal.    ED Results / Procedures / Treatments   Labs (all labs ordered are listed, but only abnormal results are displayed) Labs Reviewed  COMPREHENSIVE METABOLIC PANEL - Abnormal; Notable for the following components:      Result Value   Glucose, Bld 122 (*)    All other components within normal limits  CBC WITH DIFFERENTIAL/PLATELET - Abnormal; Notable for the following components:   WBC 11.8 (*)    Neutro Abs 8.9 (*)    All other components within normal limits  URINALYSIS, ROUTINE W REFLEX MICROSCOPIC - Abnormal; Notable for the following components:   Color, Urine STRAW (*)    Specific Gravity, Urine 1.001 (*)    All other components within normal limits    EKG None  Radiology CT Renal Stone Study  Result Date: 07/30/2021 CLINICAL DATA:  RIGHT flank  pain since yesterday worsened by movement question kidney stone EXAM: CT ABDOMEN AND PELVIS WITHOUT CONTRAST TECHNIQUE: Multidetector CT imaging of the abdomen and pelvis was performed following the standard protocol without IV contrast. Sagittal and coronal MPR images reconstructed from axial data set. No oral contrast administered COMPARISON:  01/09/2015 FINDINGS: Lower chest: Chronic atelectasis versus scarring at RIGHT lung base Hepatobiliary: Gallbladder  and liver normal appearance Pancreas: Normal appearance Spleen: Normal appearance.  Small splenule medial to spleen. Adrenals/Urinary Tract: Adrenal glands normal appearance. Cyst identified laterally at mid LEFT kidney 2.3 x 2.2 cm, with adjacent stable calcifications. Kidneys otherwise normal appearance. 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 No ureteral calcifications or dilatation. Bladder unremarkable. Stomach/Bowel: Appendix surgically absent. Diverticulosis of descending and sigmoid colon without evidence of diverticulitis. Moderate-sized hiatal hernia with surgical clips from prior hiatal hernia repair; unable to exclude gastric wall thickening at this site though this may be an artifact related to underdistention. Bowel loops otherwise normal appearance. Vascular/Lymphatic: Atherosclerotic calcifications aorta and iliac arteries without aneurysm. No adenopathy. Reproductive: Unremarkable uterus and adnexa Other: No free air or free fluid. Small supraumbilical ventral hernia containing fat. Tiny umbilical hernia containing fat. Musculoskeletal: Unremarkable IMPRESSION: Distal colonic diverticulosis without evidence of diverticulitis. 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 Stable LEFT renal cyst with adjacent calcifications. Moderate-sized hiatal hernia with surgical clips from prior hiatal hernia repair as above.  Small supraumbilical and tiny umbilical hernias containing fat. No acute intra-abdominal or intrapelvic abnormalities. Aortic Atherosclerosis (ICD10-I70.0). Electronically Signed   By: Lavonia Dana M.D.   On: 07/30/2021 13:16    Procedures Procedures   Medications Ordered in ED Medications - No data to display  ED Course  I have reviewed the triage vital signs and the nursing notes.  Pertinent labs & imaging results that were available during my care of the patient were reviewed by me and considered in my medical decision making (see chart for details).    MDM Rules/Calculators/A&P   Patient was evaluated by me in fast-track.  She was in no acute distress.  We discussed the results of her blood work and scanning at length.  She was unaware of diverticulosis but was familiar with her previous hernias.  Patient continues to deny nausea, vomiting, fever, chills.  Low suspicion for acute cause of back pain.  No signs of kidney stones.  Presentation not consistent with pyelonephritis.  Patient ambulatory and stable for discharge at this time.  She has no further concerns.  I will send her home with lidocaine patches as well as naproxen.  Use of these discussed at length.  Final Clinical Impression(s) / ED Diagnoses Final diagnoses:  Acute right-sided low back pain without sciatica    Rx / DC Orders Results and diagnoses were explained to the patient. Return precautions discussed in full. Patient had no additional questions and expressed complete understanding.     Rhae Hammock, PA-C 07/30/21 1351    Malvin Johns, MD 07/30/21 1606

## 2021-07-30 NOTE — ED Provider Notes (Signed)
Emergency Medicine Provider Triage Evaluation Note  Kelsey West , a 50 y.o. female  was evaluated in triage.  Pt complains of right flank and back pain yesterday patient started having pain over the right flank that she felt might be similar to prior kidney stones, then pain seemed to ease off and moved to the middle of her lower back with some radiation into both of her legs.  No urinary symptoms, loss of bowel or bladder control.  No numbness or weakness..  Review of Systems  Positive: Back pain, flank pain Negative: Fevers, abdominal pain, vomiting, dysuria  Physical Exam  BP (!) 153/87 (BP Location: Right Arm)   Pulse 76   Temp 98.5 F (36.9 C) (Oral)   Resp 16   LMP 09/01/2019   SpO2 97%  Gen:   Awake, no distress   Resp:  Normal effort  MSK:   Moves extremities without difficulty  Other:    Medical Decision Making  Medically screening exam initiated at 11:29 AM.  Appropriate orders placed.  Elwin Sleight was informed that the remainder of the evaluation will be completed by another provider, this initial triage assessment does not replace that evaluation, and the importance of remaining in the ED until their evaluation is complete.     Jacqlyn Larsen, PA-C 07/30/21 [facility], [place], MD 07/31/21 [phone]

## 2021-07-30 NOTE — Discharge Instructions (Signed)
Your work-up is reassuring today.  There are no signs of infection in your urine and your CT scanning reveals no kidney stones.  I do not believe you have an emergent medical condition at this time.  I am sending you lidocaine patches to the pharmacy.  Please note that you may utilize these patches for 12 hours at a time.  Make sure to have 12 hours patch free each day.  You may also utilize naproxen that I am sending to your pharmacy.  Do not take ibuprofen with this and make sure to take this medication with food.  Please read the information about back pain attached to your discharge papers.  Come back if you spike a fever, notice blood in your urine or have any overall worsening of your condition.  It was a pleasure to meet you today and I hope that you feel better.

## 2023-06-11 ENCOUNTER — Emergency Department (HOSPITAL_BASED_OUTPATIENT_CLINIC_OR_DEPARTMENT_OTHER): Payer: Medicaid Other | Admitting: Radiology

## 2023-06-11 ENCOUNTER — Emergency Department (HOSPITAL_BASED_OUTPATIENT_CLINIC_OR_DEPARTMENT_OTHER)
Admission: EM | Admit: 2023-06-11 | Discharge: 2023-06-11 | Disposition: A | Discharge status: Home / Self Care | Payer: Medicaid Other | Attending: Emergency Medicine | Admitting: Emergency Medicine

## 2023-06-11 ENCOUNTER — Other Ambulatory Visit: Payer: Self-pay

## 2023-06-11 ENCOUNTER — Encounter (HOSPITAL_BASED_OUTPATIENT_CLINIC_OR_DEPARTMENT_OTHER): Payer: Self-pay | Admitting: Emergency Medicine

## 2023-06-11 DIAGNOSIS — R079 Chest pain, unspecified: Secondary | ICD-10-CM | POA: Diagnosis present

## 2023-06-11 DIAGNOSIS — R0789 Other chest pain: Secondary | ICD-10-CM | POA: Insufficient documentation

## 2023-06-11 DIAGNOSIS — M79622 Pain in left upper arm: Secondary | ICD-10-CM | POA: Diagnosis not present

## 2023-06-11 LAB — CBC
HCT: 45.7 % (ref 36.0–46.0)
Hemoglobin: 15.1 g/dL — ABNORMAL HIGH (ref 12.0–15.0)
MCH: 31.1 pg (ref 26.0–34.0)
MCHC: 33 g/dL (ref 30.0–36.0)
MCV: 94.2 fL (ref 80.0–100.0)
Platelets: 327 10*3/uL (ref 150–400)
RBC: 4.85 MIL/uL (ref 3.87–5.11)
RDW: 12 % (ref 11.5–15.5)
WBC: 9.6 10*3/uL (ref 4.0–10.5)
nRBC: 0 % (ref 0.0–0.2)

## 2023-06-11 LAB — BASIC METABOLIC PANEL
Anion gap: 10 (ref 5–15)
BUN: 8 mg/dL (ref 6–20)
CO2: 26 mmol/L (ref 22–32)
Calcium: 9.6 mg/dL (ref 8.9–10.3)
Chloride: 103 mmol/L (ref 98–111)
Creatinine, Ser: 0.66 mg/dL (ref 0.44–1.00)
GFR, Estimated: >60 mL/min (ref >60)
Glucose, Bld: 113 mg/dL — ABNORMAL HIGH (ref 70–99)
Potassium: 4.3 mmol/L (ref 3.5–5.1)
Sodium: 139 mmol/L (ref 135–145)

## 2023-06-11 LAB — TROPONIN I (HIGH SENSITIVITY): Troponin I (High Sensitivity): <2 ng/L (ref <18)

## 2023-06-11 NOTE — ED Triage Notes (Signed)
Achy/ chest pain under arms and mid chest Denies sob no n/v/d Started 3 weeks ago

## 2023-06-11 NOTE — ED Provider Notes (Signed)
[place] EMERGENCY DEPARTMENT AT [facility]  Provider Note  CSN: [id number] Arrival date & time: 06/11/23 1558  History Chief Complaint  Patient presents with   Chest Pain    Kelsey West is a 52 y.o. female with history of chronic back pain and fibromyalgia reports several weeks of aching pain in her bilateral L>R axilla and in her mid chest. No particular provoking or relieving factors. No fever, cough, congestion or SOB. Not worse with movement. She states her daughter convinced her to come today because breast cancer runs in her family. She has not felt any lumps in her breast or axillae. She also reports she has been checked for leukemia twice in the past for elevated WBC but those workups were negative.    Home Medications Prior to Admission medications   Medication Sig Start Date End Date Taking? Authorizing Provider  cephALEXin (KEFLEX) 500 MG capsule Take 1 capsule (500 mg total) by mouth 4 (four) times daily. 02/22/15   Everlene Farrier, PA-C  gabapentin (NEURONTIN) 300 MG capsule Take 1 capsule (300 mg total) by mouth 4 (four) times daily. 04/28/14   Kirsteins, Victorino Sparrow, MD  HYDROcodone-acetaminophen (NORCO/VICODIN) 5-325 MG tablet Take 1 tablet by mouth every 6 (six) hours as needed for severe pain. 10/29/19   Caccavale, Sophia, PA-C  hydrocortisone cream 0.5 % Apply topically 2 (two) times daily. 06/10/13   Quentin Angst, MD  ibuprofen (ADVIL,MOTRIN) 600 MG tablet Take 1 tablet (600 mg total) by mouth every 6 (six) hours as needed. 08/11/16   Barrett Henle, PA-C  lidocaine (LIDODERM) 5 % Place 2 patches onto the skin daily. Remove & Discard patch within 12 hours or as directed by MD 07/30/21   Redwine, Madison A, PA-C  naproxen (NAPROSYN) 500 MG tablet Take 1 tablet (500 mg total) by mouth 2 (two) times daily with a meal. 07/30/21   Redwine, Madison A, PA-C  omeprazole (PRILOSEC) 20 MG capsule Take 1 capsule (20 mg total) by mouth daily. 02/22/15    Everlene Farrier, PA-C  potassium chloride 20 MEQ TBCR Take 40 mEq by mouth daily. Take twice daily for 2 days then once daily till finished. 05/19/15   Johney Maine, MD     Allergies    Patient has no known allergies.   Review of Systems   Review of Systems Please see HPI for pertinent positives and negatives  Physical Exam BP (!) 154/87 (BP Location: Right Arm)   Pulse 69   Temp 98.5 F (36.9 C) (Oral)   Resp 18   LMP 09/01/2019   SpO2 94%   Physical Exam Vitals and nursing note reviewed. Exam conducted with a chaperone present.  Constitutional:      Appearance: Normal appearance.  HENT:     Head: Normocephalic and atraumatic.     Nose: Nose normal.     Mouth/Throat:     Mouth: Mucous membranes are moist.  Eyes:     Extraocular Movements: Extraocular movements intact.     Conjunctiva/sclera: Conjunctivae normal.  Cardiovascular:     Rate and Rhythm: Normal rate.  Pulmonary:     Effort: Pulmonary effort is normal.     Breath sounds: Normal breath sounds.  Chest:     Comments: Mild tenderness in bilateral axillae, L>R, no breast mass, no lymphadenopathy Abdominal:     General: Abdomen is flat.     Palpations: Abdomen is soft.     Tenderness: There is no abdominal tenderness.  Musculoskeletal:  General: No swelling. Normal range of motion.     Cervical back: Neck supple.  Skin:    General: Skin is warm and dry.  Neurological:     General: No focal deficit present.     Mental Status: She is alert.  Psychiatric:        Mood and Affect: Mood normal.     ED Results / Procedures / Treatments   EKG EKG Interpretation Date/Time:  Sunday June 11 2023 16:07:46 EDT Ventricular Rate:  72 PR Interval:  138 QRS Duration:  76 QT Interval:  388 QTC Calculation: 424 R Axis:   68  Text Interpretation: Normal sinus rhythm Cannot rule out Anterior infarct , age undetermined Abnormal ECG When compared with ECG of 22-Feb-2015 14:18, PREVIOUS ECG IS  PRESENT Nonspecific ST and T wave abnormality in inferior leads compared with previous from 2016 Confirmed by Susy Frizzle [phone]) on 06/11/2023 4:30:16 PM  Procedures Procedures  Medications Ordered in the ED Medications - No data to display  Initial Impression and Plan  Patient here with atypical chest pain, really originates in axillae. Exam is benign. Low risk for ACS. Given duration of symptoms doubt AMI. Will check labs and CXR. If negative, anticipate discharge with outpatient management.   ED Course   Clinical Course as of 06/11/23 1733  Wynelle Link Jun 11, 2023  1651 CBC is normal. Trop is negative. Given duration of symptoms, repeat is not indicated at this ED visit.  [CS]  1703 BMP is normal.  [CS]  1729 I personally viewed the images from radiology studies and agree with radiologist interpretation:  CXR without acute findings. No clear etiology for patient's pain, consider exacerbation of her fibromyalgia, but no emergent findings on ED visit today. Recommend OTC pain meds, PCP follow up, RTED for any other concerns.   [CS]    Clinical Course User Index [CS] Pollyann Savoy, MD     MDM Rules/Calculators/A&P Medical Decision Making Problems Addressed: Atypical chest pain: acute illness or injury Pain in left axilla: acute illness or injury  Amount and/or Complexity of Data Reviewed Labs: ordered. Decision-making details documented in ED Course. Radiology: ordered and independent interpretation performed. Decision-making details documented in ED Course. ECG/medicine tests: ordered and independent interpretation performed. Decision-making details documented in ED Course.  Risk OTC drugs.     Final Clinical Impression(s) / ED Diagnoses Final diagnoses:  Atypical chest pain  Pain in left axilla    Rx / DC Orders ED Discharge Orders     None        Pollyann Savoy, MD 06/11/23 1733

## 2024-08-29 ENCOUNTER — Other Ambulatory Visit: Payer: Self-pay

## 2024-08-29 ENCOUNTER — Inpatient Hospital Stay (HOSPITAL_COMMUNITY)
Admission: EM | Admit: 2024-08-29 | Discharge: 2024-09-03 | DRG: 189 | Disposition: A | Discharge status: Home / Self Care | Attending: Family Medicine | Admitting: Family Medicine

## 2024-08-29 ENCOUNTER — Encounter (HOSPITAL_COMMUNITY): Payer: Self-pay

## 2024-08-29 ENCOUNTER — Emergency Department (HOSPITAL_COMMUNITY)

## 2024-08-29 DIAGNOSIS — Z791 Long term (current) use of non-steroidal anti-inflammatories (NSAID): Secondary | ICD-10-CM

## 2024-08-29 DIAGNOSIS — D72828 Other elevated white blood cell count: Secondary | ICD-10-CM | POA: Diagnosis present

## 2024-08-29 DIAGNOSIS — D7589 Other specified diseases of blood and blood-forming organs: Secondary | ICD-10-CM | POA: Diagnosis present

## 2024-08-29 DIAGNOSIS — T380X5A Adverse effect of glucocorticoids and synthetic analogues, initial encounter: Secondary | ICD-10-CM | POA: Diagnosis present

## 2024-08-29 DIAGNOSIS — J441 Chronic obstructive pulmonary disease with (acute) exacerbation: Principal | ICD-10-CM | POA: Diagnosis present

## 2024-08-29 DIAGNOSIS — F17210 Nicotine dependence, cigarettes, uncomplicated: Secondary | ICD-10-CM | POA: Diagnosis present

## 2024-08-29 DIAGNOSIS — J123 Human metapneumovirus pneumonia: Secondary | ICD-10-CM | POA: Diagnosis present

## 2024-08-29 DIAGNOSIS — R0902 Hypoxemia: Secondary | ICD-10-CM

## 2024-08-29 DIAGNOSIS — Z79899 Other long term (current) drug therapy: Secondary | ICD-10-CM | POA: Diagnosis not present

## 2024-08-29 DIAGNOSIS — J9601 Acute respiratory failure with hypoxia: Principal | ICD-10-CM | POA: Diagnosis present

## 2024-08-29 DIAGNOSIS — Z8349 Family history of other endocrine, nutritional and metabolic diseases: Secondary | ICD-10-CM

## 2024-08-29 DIAGNOSIS — E876 Hypokalemia: Secondary | ICD-10-CM | POA: Diagnosis present

## 2024-08-29 DIAGNOSIS — M797 Fibromyalgia: Secondary | ICD-10-CM | POA: Diagnosis present

## 2024-08-29 DIAGNOSIS — R7989 Other specified abnormal findings of blood chemistry: Secondary | ICD-10-CM | POA: Diagnosis present

## 2024-08-29 DIAGNOSIS — G8929 Other chronic pain: Secondary | ICD-10-CM | POA: Diagnosis present

## 2024-08-29 DIAGNOSIS — M549 Dorsalgia, unspecified: Secondary | ICD-10-CM | POA: Diagnosis present

## 2024-08-29 DIAGNOSIS — Z803 Family history of malignant neoplasm of breast: Secondary | ICD-10-CM

## 2024-08-29 DIAGNOSIS — J9811 Atelectasis: Secondary | ICD-10-CM | POA: Diagnosis present

## 2024-08-29 DIAGNOSIS — R7401 Elevation of levels of liver transaminase levels: Secondary | ICD-10-CM | POA: Diagnosis present

## 2024-08-29 DIAGNOSIS — Z716 Tobacco abuse counseling: Secondary | ICD-10-CM

## 2024-08-29 DIAGNOSIS — Z1152 Encounter for screening for COVID-19: Secondary | ICD-10-CM

## 2024-08-29 LAB — CBC WITH DIFFERENTIAL/PLATELET
Abs Immature Granulocytes: 0.09 K/uL — ABNORMAL HIGH (ref 0.00–0.07)
Basophils Absolute: 0.1 K/uL (ref 0.0–0.1)
Basophils Relative: 0 %
Eosinophils Absolute: 0 K/uL (ref 0.0–0.5)
Eosinophils Relative: 0 %
HCT: 44.1 % (ref 36.0–46.0)
Hemoglobin: 14.5 g/dL (ref 12.0–15.0)
Immature Granulocytes: 1 %
Lymphocytes Relative: 13 %
Lymphs Abs: 1.7 K/uL (ref 0.7–4.0)
MCH: 31.1 pg (ref 26.0–34.0)
MCHC: 32.9 g/dL (ref 30.0–36.0)
MCV: 94.6 fL (ref 80.0–100.0)
Monocytes Absolute: 1.4 K/uL — ABNORMAL HIGH (ref 0.1–1.0)
Monocytes Relative: 11 %
Neutro Abs: 10 K/uL — ABNORMAL HIGH (ref 1.7–7.7)
Neutrophils Relative %: 75 %
Platelets: 300 K/uL (ref 150–400)
RBC: 4.66 MIL/uL (ref 3.87–5.11)
RDW: 11.9 % (ref 11.5–15.5)
WBC: 13.3 K/uL — ABNORMAL HIGH (ref 4.0–10.5)
nRBC: 0 % (ref 0.0–0.2)

## 2024-08-29 LAB — COMPREHENSIVE METABOLIC PANEL WITH GFR
ALT: 48 U/L — ABNORMAL HIGH (ref 0–44)
AST: 32 U/L (ref 15–41)
Albumin: 3.7 g/dL (ref 3.5–5.0)
Alkaline Phosphatase: 92 U/L (ref 38–126)
Anion gap: 10 (ref 5–15)
BUN: 14 mg/dL (ref 6–20)
CO2: 31 mmol/L (ref 22–32)
Calcium: 9.2 mg/dL (ref 8.9–10.3)
Chloride: 99 mmol/L (ref 98–111)
Creatinine, Ser: 0.57 mg/dL (ref 0.44–1.00)
GFR, Estimated: >60 mL/min (ref >60)
Glucose, Bld: 118 mg/dL — ABNORMAL HIGH (ref 70–99)
Potassium: 3.2 mmol/L — ABNORMAL LOW (ref 3.5–5.1)
Sodium: 140 mmol/L (ref 135–145)
Total Bilirubin: 1 mg/dL (ref 0.0–1.2)
Total Protein: 7.7 g/dL (ref 6.5–8.1)

## 2024-08-29 LAB — RESP PANEL BY RT-PCR (RSV, FLU A&B, COVID)  RVPGX2
Influenza A by PCR: NEGATIVE
Influenza B by PCR: NEGATIVE
Resp Syncytial Virus by PCR: NEGATIVE
SARS Coronavirus 2 by RT PCR: NEGATIVE

## 2024-08-29 LAB — PRO BRAIN NATRIURETIC PEPTIDE: Pro Brain Natriuretic Peptide: 252 pg/mL (ref <300.0)

## 2024-08-29 LAB — BLOOD GAS, VENOUS
Acid-Base Excess: 9.6 mmol/L — ABNORMAL HIGH (ref 0.0–2.0)
Bicarbonate: 35.4 mmol/L — ABNORMAL HIGH (ref 20.0–28.0)
O2 Saturation: 88.7 %
Patient temperature: 37
pCO2, Ven: 51 mmHg (ref 44–60)
pH, Ven: 7.45 — ABNORMAL HIGH (ref 7.25–7.43)
pO2, Ven: 55 mmHg — ABNORMAL HIGH (ref 32–45)

## 2024-08-29 LAB — MAGNESIUM: Magnesium: 2.2 mg/dL (ref 1.7–2.4)

## 2024-08-29 LAB — PROCALCITONIN: Procalcitonin: <0.1 ng/mL

## 2024-08-29 MED ORDER — ENOXAPARIN SODIUM 40 MG/0.4ML IJ SOSY
40.0000 mg | PREFILLED_SYRINGE | INTRAMUSCULAR | Status: DC
  Administered 2024-08-29 – 2024-09-02 (×5): 40 mg via SUBCUTANEOUS
  Filled 2024-08-29 (×5): qty 0.4

## 2024-08-29 MED ORDER — AZITHROMYCIN 250 MG PO TABS
500.0000 mg | ORAL_TABLET | Freq: Every day | ORAL | Status: AC
  Administered 2024-08-30 – 2024-08-31 (×2): 500 mg via ORAL
  Filled 2024-08-29 (×2): qty 2

## 2024-08-29 MED ORDER — METHYLPREDNISOLONE SODIUM SUCC 40 MG IJ SOLR
40.0000 mg | Freq: Two times a day (BID) | INTRAMUSCULAR | Status: AC
  Administered 2024-08-30 (×2): 40 mg via INTRAVENOUS
  Filled 2024-08-29 (×2): qty 1

## 2024-08-29 MED ORDER — SODIUM CHLORIDE 0.9 % IV SOLN
500.0000 mg | INTRAVENOUS | Status: AC
  Administered 2024-08-29: 500 mg via INTRAVENOUS
  Filled 2024-08-29: qty 5

## 2024-08-29 MED ORDER — MAGNESIUM SULFATE 2 GM/50ML IV SOLN
2.0000 g | Freq: Once | INTRAVENOUS | Status: AC
  Administered 2024-08-29: 2 g via INTRAVENOUS
  Filled 2024-08-29: qty 50

## 2024-08-29 MED ORDER — ACETAMINOPHEN 325 MG PO TABS
650.0000 mg | ORAL_TABLET | Freq: Four times a day (QID) | ORAL | Status: DC | PRN
  Administered 2024-09-01 – 2024-09-02 (×2): 650 mg via ORAL
  Filled 2024-08-29 (×2): qty 2

## 2024-08-29 MED ORDER — ALBUTEROL SULFATE (2.5 MG/3ML) 0.083% IN NEBU
15.0000 mg/h | INHALATION_SOLUTION | Freq: Once | RESPIRATORY_TRACT | Status: AC
  Administered 2024-08-29: 15 mg/h via RESPIRATORY_TRACT
  Filled 2024-08-29: qty 18

## 2024-08-29 MED ORDER — IPRATROPIUM-ALBUTEROL 0.5-2.5 (3) MG/3ML IN SOLN
3.0000 mL | Freq: Four times a day (QID) | RESPIRATORY_TRACT | Status: DC
  Administered 2024-08-29 – 2024-08-30 (×5): 3 mL via RESPIRATORY_TRACT
  Filled 2024-08-29 (×5): qty 3

## 2024-08-29 MED ORDER — POTASSIUM CHLORIDE CRYS ER 20 MEQ PO TBCR
40.0000 meq | EXTENDED_RELEASE_TABLET | ORAL | Status: AC
  Administered 2024-08-29 (×2): 40 meq via ORAL
  Filled 2024-08-29 (×2): qty 2

## 2024-08-29 MED ORDER — ACETAMINOPHEN 650 MG RE SUPP: 650.0000 mg | Freq: Four times a day (QID) | RECTAL | Status: DC | PRN

## 2024-08-29 MED ORDER — SODIUM CHLORIDE 0.9 % IV BOLUS
1000.0000 mL | Freq: Once | INTRAVENOUS | Status: AC
  Administered 2024-08-29: 1000 mL via INTRAVENOUS

## 2024-08-29 MED ORDER — ORAL CARE MOUTH RINSE: 15.0000 mL | OROMUCOSAL | Status: DC | PRN

## 2024-08-29 MED ORDER — METHYLPREDNISOLONE SODIUM SUCC 125 MG IJ SOLR
125.0000 mg | Freq: Once | INTRAMUSCULAR | Status: AC
  Administered 2024-08-29: 125 mg via INTRAVENOUS
  Filled 2024-08-29: qty 2

## 2024-08-29 MED ORDER — CHLORHEXIDINE GLUCONATE CLOTH 2 % EX PADS
6.0000 | MEDICATED_PAD | Freq: Every day | CUTANEOUS | Status: DC
  Administered 2024-08-29 – 2024-08-30 (×2): 6 via TOPICAL

## 2024-08-29 MED ORDER — NICOTINE 21 MG/24HR TD PT24
21.0000 mg | MEDICATED_PATCH | Freq: Every day | TRANSDERMAL | Status: DC
  Administered 2024-08-29 – 2024-09-03 (×6): 21 mg via TRANSDERMAL
  Filled 2024-08-29 (×6): qty 1

## 2024-08-29 MED ORDER — BUDESONIDE 0.5 MG/2ML IN SUSP
0.5000 mg | Freq: Two times a day (BID) | RESPIRATORY_TRACT | Status: DC
  Administered 2024-08-29 – 2024-09-01 (×6): 0.5 mg via RESPIRATORY_TRACT
  Filled 2024-08-29 (×6): qty 2

## 2024-08-29 MED ORDER — PREDNISONE 20 MG PO TABS
40.0000 mg | ORAL_TABLET | Freq: Every day | ORAL | Status: DC
  Administered 2024-08-31: 40 mg via ORAL
  Filled 2024-08-29: qty 2

## 2024-08-29 MED ORDER — IPRATROPIUM BROMIDE 0.02 % IN SOLN
1.5000 mg | Freq: Once | RESPIRATORY_TRACT | Status: AC
  Administered 2024-08-29: 0.5 mg via RESPIRATORY_TRACT
  Filled 2024-08-29: qty 7.5

## 2024-08-29 NOTE — ED Triage Notes (Signed)
 Pt BIBA from clinic, c/o SOB, denies distress, Been feeling sick past couple of days. Hx of COPD  BP 128/90 HR 88 RR 20 O2 5L 93 CBG 107

## 2024-08-29 NOTE — ED Provider Notes (Signed)
 [place] EMERGENCY DEPARTMENT AT [facility] Provider Note   CSN: [id number] Arrival date & time: 08/29/24  1524     Patient presents with: Shortness of Breath   Kelsey West is a 53 y.o. female smoker, here presenting with shortness of breath.  Patient states that her daughter is sick recently.  She states that she started having cough and congestion for the last 4 to 5 days.  She states that she has been coughing some yellow sputum.  Some subjective chills as well.  Patient smokes 1 pack a day and recently cut back to about half a pack a day.  Patient denies any recent travel.  Denies any history of COPD   The history is provided by the patient.       Prior to Admission medications   Medication Sig Start Date End Date Taking? Authorizing Provider  cephALEXin  (KEFLEX ) 500 MG capsule Take 1 capsule (500 mg total) by mouth 4 (four) times daily. 02/22/15   Dansie, William, PA-C  gabapentin  (NEURONTIN ) 300 MG capsule Take 1 capsule (300 mg total) by mouth 4 (four) times daily. 04/28/14   Kirsteins, Prentice BRAVO, MD  HYDROcodone -acetaminophen  (NORCO/VICODIN) 5-325 MG tablet Take 1 tablet by mouth every 6 (six) hours as needed for severe pain. 10/29/19   Caccavale, Sophia, PA-C  hydrocortisone  cream 0.5 % Apply topically 2 (two) times daily. 06/10/13   Jegede, Olugbemiga E, MD  ibuprofen  (ADVIL ,MOTRIN ) 600 MG tablet Take 1 tablet (600 mg total) by mouth every 6 (six) hours as needed. 08/11/16   Nevelyn Nat Norris, PA-C  lidocaine  (LIDODERM ) 5 % Place 2 patches onto the skin daily. Remove & Discard patch within 12 hours or as directed by MD 07/30/21   Redwine, Madison A, PA-C  naproxen  (NAPROSYN ) 500 MG tablet Take 1 tablet (500 mg total) by mouth 2 (two) times daily with a meal. 07/30/21   Redwine, Madison A, PA-C  omeprazole  (PRILOSEC) 20 MG capsule Take 1 capsule (20 mg total) by mouth daily. 02/22/15   Dansie, William, PA-C  potassium chloride  20 MEQ TBCR Take 40 mEq by mouth  daily. Take 40meq twice daily for 2 days then once daily till finished. 05/19/15   Kale, Gautam Kishore, MD    Allergies: Patient has no known allergies.    Review of Systems  Respiratory:  Positive for shortness of breath.   All other systems reviewed and are negative.   Updated Vital Signs BP (!) 142/84 (BP Location: Right Arm)   Pulse 90   Resp 18   Ht 5' 6 (1.676 m)   Wt 68 kg   LMP 09/01/2019   SpO2 (!) 81%   BMI 24.21 kg/m   Physical Exam Vitals and nursing note reviewed.  Constitutional:      Comments: Tachypneic  HENT:     Head: Normocephalic.     Mouth/Throat:     Mouth: Mucous membranes are moist.  Eyes:     Extraocular Movements: Extraocular movements intact.     Pupils: Pupils are equal, round, and reactive to light.  Cardiovascular:     Rate and Rhythm: Normal rate and regular rhythm.  Pulmonary:     Comments: Diffuse wheezing bilaterally.  Diminished breath sounds Musculoskeletal:        General: Normal range of motion.     Cervical back: Normal range of motion and neck supple.  Skin:    General: Skin is warm.     Capillary Refill: Capillary refill takes less than 2  seconds.  Neurological:     General: No focal deficit present.     Mental Status: She is oriented to person, place, and time.  Psychiatric:        Mood and Affect: Mood normal.        Behavior: Behavior normal.     (all labs ordered are listed, but only abnormal results are displayed) Labs Reviewed  CBC WITH DIFFERENTIAL/PLATELET - Abnormal; Notable for the following components:      Result Value   WBC 13.3 (*)    Neutro Abs 10.0 (*)    Monocytes Absolute 1.4 (*)    Abs Immature Granulocytes 0.09 (*)    All other components within normal limits  COMPREHENSIVE METABOLIC PANEL WITH GFR - Abnormal; Notable for the following components:   Potassium 3.2 (*)    Glucose, Bld 118 (*)    ALT 48 (*)    All other components within normal limits  BLOOD GAS, VENOUS - Abnormal; Notable for  the following components:   pH, Ven 7.45 (*)    pO2, Ven 55 (*)    Bicarbonate 35.4 (*)    Acid-Base Excess 9.6 (*)    All other components within normal limits  RESP PANEL BY RT-PCR (RSV, FLU A&B, COVID)  RVPGX2    EKG: EKG Interpretation Date/Time:  Thursday August 29 2024 15:47:27 EST Ventricular Rate:  92 PR Interval:  127 QRS Duration:  87 QT Interval:  359 QTC Calculation: 445 R Axis:   60  Text Interpretation: Sinus rhythm Abnormal R-wave progression, early transition No significant change since last tracing Confirmed by Patt Alm DEL ([id number]) on 08/29/2024 3:49:40 PM  Radiology: [facility] Chest Port 1 View Result Date: 08/29/2024 CLINICAL DATA:  Shortness of breath. EXAM: PORTABLE CHEST 1 VIEW COMPARISON:  Chest radiograph dated 06/11/2023. FINDINGS: Bibasilar atelectasis. No focal consolidation, pleural effusion, pneumothorax. The cardiac silhouette is within normal limits. No acute osseous pathology. IMPRESSION: Bibasilar atelectasis. No focal consolidation. Electronically Signed   By: Vanetta Chou M.D.   On: 08/29/2024 16:11     Procedures   CRITICAL CARE Performed by: Alm DEL Patt   Total critical care time: 39 minutes  Critical care time was exclusive of separately billable procedures and treating other patients.  Critical care was necessary to treat or prevent imminent or life-threatening deterioration.  Critical care was time spent personally by me on the following activities: development of treatment plan with patient and/or surrogate as well as nursing, discussions with consultants, evaluation of patient's response to treatment, examination of patient, obtaining history from patient or surrogate, ordering and performing treatments and interventions, ordering and review of laboratory studies, ordering and review of radiographic studies, pulse oximetry and re-evaluation of patient's condition.   Medications Ordered in the ED  albuterol (PROVENTIL) (2.5 MG/3ML)  0.083% nebulizer solution (15 mg/hr Nebulization Given 08/29/24 1558)  ipratropium (ATROVENT) nebulizer solution 1.5 mg (0.5 mg Nebulization Given 08/29/24 1558)  methylPREDNISolone  sodium succinate (SOLU-MEDROL ) 125 mg/2 mL injection 125 mg (125 mg Intravenous Given 08/29/24 1604)  sodium chloride  0.9 % bolus 1,000 mL (1,000 mLs Intravenous New Bag/Given 08/29/24 1606)  magnesium sulfate IVPB 2 g 50 mL (2 g Intravenous New Bag/Given 08/29/24 1606)                                    Medical Decision Making Kelsey West is a 53 y.o. female here presenting with shortness of  breath.  Patient is a smoker and likely has underlying COPD.  Likely COPD exacerbation versus pneumonia.  Plan to get CBC and CMP and VBG and chest x-ray  5:45 PM Reviewed patient's labs and VBG is reassuring.  Chest x-ray did not show any pneumonia.  Patient finished continuous neb and feeling better.  However oxygen dropped to 81%.  Patient initially wanted to go home.  I coordinated with care management regarding sending her home with oxygen.  However even with 5 L nasal cannula, oxygen only came up to about 87%.  Will start patient on high flow at 10 L.  Goal oxygen is around 90%.  Patient will be admitted for hypoxia from COPD exacerbation  Problems Addressed: Chronic obstructive pulmonary disease with acute exacerbation (HCC): acute illness or injury Hypoxia: acute illness or injury  Amount and/or Complexity of Data Reviewed Labs: ordered. Decision-making details documented in ED Course. Radiology: ordered and independent interpretation performed. Decision-making details documented in ED Course. ECG/medicine tests: ordered and independent interpretation performed. Decision-making details documented in ED Course.  Risk Prescription drug management. Decision regarding hospitalization.     Final diagnoses:  None    ED Discharge Orders          Ordered    For home use only DME oxygen        08/29/24  1724               Patt Alm Macho, MD 08/29/24 [phone]

## 2024-08-29 NOTE — Care Management (Signed)
 Parameters for documentation of oxygen sent to nursing . The patient will not receive oxygen tonight.  Qualification need to be done and the patient will need to board. Provider aware

## 2024-08-29 NOTE — H&P (Signed)
 History and Physical    Patient: Kelsey West FMW:[id number] DOB: Sep 08, 1971 DOA: 08/29/2024 DOS: the patient was seen and examined on 08/29/2024 PCP: Pcp, No  Patient coming from: Home.  Independently ambulates at baseline.  Chief Complaint:  Chief Complaint  Patient presents with   Shortness of Breath   HPI: Kelsey West is a 53 y.o. female with PMH of chronic back pain, fibromyalgia and tobacco use disorder presenting with shortness of breath, congestion and cough.  Patient reports new onset nasal congestion and chest congestion about 4 days ago.  Her daughter-in-law had similar symptoms.  She also started having shortness of breath and productive cough with whitish phlegm.  Denies hemoptysis.  No fever or rhinorrhea.  Denies chest pain.  Has tried to manage cough with over-the-counter Mucinex.  She took a daughter-in-law to the doctor's office today and noted to have shortness of breath and DOE.  Clinic staff called ambulance and she was brought to ED for further evaluation.  She says her cough is better today.  She also reports improvement in her breathing after treatment in ED.  Patient denies formal diagnosis of COPD but over 30-pack-year history of smoking.  Denies history of asthma, diabetes, hypertension, heart disease or blood clots.  Reports 30-pack-year history of smoking.  Recently cut down to half a pack a day.  Denies alcohol or recreational drug use.  She is interested in cardiopulmonary resuscitation in event of sudden cardiopulmonary arrest.  In ED, desaturated to 81% on RA.  Started on 25 L by HFNC.  Tachypneic to 22.  VBG 7.45/51/55/35.4.  K3.2.  Glucose 118.  ALT 48.  WBC 13.3 with left shift.  COVID-19, influenza and RSV PCR nonreactive.  CXR with bibasilar atelectasis.  Received Solu-Medrol , bronchodilators, magnesium and 1 L bolus.  Admission requested respiratory failure in the setting of possible COPD exacerbation.   Review of Systems: As mentioned in the  history of present illness. All other systems reviewed and are negative. Past Medical History:  Diagnosis Date   Arthritis    Chronic back pain    Degenerative disc disease, lumbar    Fibromyalgia 01/2014   Incisional hernia    Smoker    Past Surgical History:  Procedure Laterality Date   APPENDECTOMY     HERNIA REPAIR     HIATAL HERNIA REPAIR  2001   TUBAL LIGATION     TUMOR REMOVAL     from right pinky finger   Social History:  reports that she has been smoking cigarettes. She has a 15 pack-year smoking history. She has never used smokeless tobacco. She reports that she does not drink alcohol and does not use drugs.  No Known Allergies  Family History  Problem Relation Age of Onset   Thyroid disease Mother    Other Brother        Polyarteritis nodosa   Thyroid disease Paternal Grandmother    Cancer Paternal Grandmother    Breast cancer Paternal Uncle     Prior to Admission medications   Medication Sig Start Date End Date Taking? Authorizing Provider  cephALEXin  (KEFLEX ) 500 MG capsule Take 1 capsule (500 mg total) by mouth 4 (four) times daily. 02/22/15   Dansie, William, PA-C  gabapentin  (NEURONTIN ) 300 MG capsule Take 1 capsule (300 mg total) by mouth 4 (four) times daily. 04/28/14   Kirsteins, Prentice BRAVO, MD  HYDROcodone -acetaminophen  (NORCO/VICODIN) 5-325 MG tablet Take 1 tablet by mouth every 6 (six) hours as needed for severe pain. 10/29/19  Caccavale, Sophia, PA-C  hydrocortisone  cream 0.5 % Apply topically 2 (two) times daily. 06/10/13   Jegede, Olugbemiga E, MD  ibuprofen  (ADVIL ,MOTRIN ) 600 MG tablet Take 1 tablet (600 mg total) by mouth every 6 (six) hours as needed. 08/11/16   Nevelyn Nat Norris, PA-C  lidocaine  (LIDODERM ) 5 % Place 2 patches onto the skin daily. Remove & Discard patch within 12 hours or as directed by MD 07/30/21   Redwine, Madison A, PA-C  naproxen  (NAPROSYN ) 500 MG tablet Take 1 tablet (500 mg total) by mouth 2 (two) times daily with a meal.  07/30/21   Redwine, Madison A, PA-C  omeprazole  (PRILOSEC) 20 MG capsule Take 1 capsule (20 mg total) by mouth daily. 02/22/15   Dansie, William, PA-C  potassium chloride  20 MEQ TBCR Take 40 mEq by mouth daily. Take 40meq twice daily for 2 days then once daily till finished. 05/19/15   Onesimo Emaline Brink, MD    Physical Exam: Vitals:   08/29/24 1540 08/29/24 1607 08/29/24 1728 08/29/24 1805  BP: (!) 142/84     Pulse: 90     Resp: 18   (!) 22  SpO2: 91%  (!) 81%   Weight:  68 kg    Height:  5' 6 (1.676 m)     GENERAL: No apparent distress.  Nontoxic. HEENT: MMM.  Vision and hearing grossly intact.  NECK: Supple.  No apparent JVD.  RESP: On 25 L HFNC.  No IWOB.  Initial aeration bilaterally. CVS:  RRR. Heart sounds normal.  ABD/GI/GU: BS+. Abd soft, NTND.  MSK/EXT:   No apparent deformity. Moves extremities. No edema.  SKIN: no apparent skin lesion or wound NEURO: Awake and alert. Oriented appropriately.  No apparent focal neuro deficit. PSYCH: Calm. Normal affect.  Data Reviewed: See HPI  Assessment and Plan: Acute respiratory failure with hypoxia due to possible COPD exacerbation: Presents with congestion, productive cough, SOB and DOE.  Family member with similar symptoms.  No formal diagnosis of asthma or COPD but 30-pack-year history.  Desaturated to 81% on RA requiring up to 25 L by HFNC to maintain saturation in 90s.  COVID-19, influenza and RSV PCR nonreactive.  CXR with bibasilar atelectasis.  Received Solu-Medrol , nebs and magnesium in ED -COPD pathway with systemic steroid, scheduled and as needed nebulizers -Mucolytics and antitussive. -Check 20 pathogen RVP, procalcitonin, BNP -Wean oxygen as able. - Encouraged smoking cessation  Tobacco use disorder: About 30-pack-year history.  Recently cut down to half a pack a day. - Encouraged smoking cessation - Nicotine patch  Hypokalemia -Monitor replenish K and Mg as appropriate  Elevated ALT: Mild - Recheck in the  morning  Fibromyalgia/chronic back pain - As needed pain meds  Medication management - Med rec pending at time of this dictation.     Advance Care Planning:   Code Status: Full Code -discussed with patient  Consults: None  Family Communication: None at bedside  Severity of Illness: The appropriate patient status for this patient is INPATIENT. Inpatient status is judged to be reasonable and necessary in order to provide the required intensity of service to ensure the patient's safety. The patient's presenting symptoms, physical exam findings, and initial radiographic and laboratory data in the context of their chronic comorbidities is felt to place them at high risk for further clinical deterioration. Furthermore, it is not anticipated that the patient will be medically stable for discharge from the hospital within 2 midnights of admission.   * I certify that at the point of  admission it is my clinical judgment that the patient will require inpatient hospital care spanning beyond 2 midnights from the point of admission due to high intensity of service, high risk for further deterioration and high frequency of surveillance required.*  Author: Mignon ONEIDA Bump, MD 08/29/2024 6:39 PM  For on call review [email or web address].

## 2024-08-30 ENCOUNTER — Inpatient Hospital Stay (HOSPITAL_COMMUNITY)

## 2024-08-30 ENCOUNTER — Encounter (HOSPITAL_COMMUNITY): Payer: Self-pay | Admitting: Student

## 2024-08-30 DIAGNOSIS — J441 Chronic obstructive pulmonary disease with (acute) exacerbation: Secondary | ICD-10-CM | POA: Diagnosis not present

## 2024-08-30 LAB — RESPIRATORY PANEL BY PCR

## 2024-08-30 LAB — COMPREHENSIVE METABOLIC PANEL WITH GFR
ALT: 39 U/L (ref 0–44)
AST: 24 U/L (ref 15–41)
Albumin: 3.4 g/dL — ABNORMAL LOW (ref 3.5–5.0)
Alkaline Phosphatase: 80 U/L (ref 38–126)
Anion gap: 9 (ref 5–15)
BUN: 13 mg/dL (ref 6–20)
CO2: 27 mmol/L (ref 22–32)
Calcium: 9 mg/dL (ref 8.9–10.3)
Chloride: 105 mmol/L (ref 98–111)
Creatinine, Ser: 0.44 mg/dL (ref 0.44–1.00)
GFR, Estimated: >60 mL/min (ref >60)
Glucose, Bld: 156 mg/dL — ABNORMAL HIGH (ref 70–99)
Potassium: 4.4 mmol/L (ref 3.5–5.1)
Sodium: 141 mmol/L (ref 135–145)
Total Bilirubin: 0.6 mg/dL (ref 0.0–1.2)
Total Protein: 6.9 g/dL (ref 6.5–8.1)

## 2024-08-30 LAB — MRSA NEXT GEN BY PCR, NASAL: MRSA by PCR Next Gen: NOT DETECTED

## 2024-08-30 LAB — CBC
HCT: 40.1 % (ref 36.0–46.0)
Hemoglobin: 13 g/dL (ref 12.0–15.0)
MCH: 30.6 pg (ref 26.0–34.0)
MCHC: 32.4 g/dL (ref 30.0–36.0)
MCV: 94.4 fL (ref 80.0–100.0)
Platelets: 287 K/uL (ref 150–400)
RBC: 4.25 MIL/uL (ref 3.87–5.11)
RDW: 12 % (ref 11.5–15.5)
WBC: 15.4 K/uL — ABNORMAL HIGH (ref 4.0–10.5)
nRBC: 0 % (ref 0.0–0.2)

## 2024-08-30 LAB — D-DIMER, QUANTITATIVE: D-Dimer, Quant: 0.65 ug{FEU}/mL — ABNORMAL HIGH (ref 0.00–0.50)

## 2024-08-30 LAB — HIV ANTIBODY (ROUTINE TESTING W REFLEX): HIV Screen 4th Generation wRfx: NONREACTIVE

## 2024-08-30 MED ORDER — IPRATROPIUM-ALBUTEROL 0.5-2.5 (3) MG/3ML IN SOLN
3.0000 mL | Freq: Three times a day (TID) | RESPIRATORY_TRACT | Status: DC
  Administered 2024-08-31 – 2024-09-03 (×11): 3 mL via RESPIRATORY_TRACT
  Filled 2024-08-30 (×11): qty 3

## 2024-08-30 MED ORDER — ADULT MULTIVITAMIN W/MINERALS CH
1.0000 | ORAL_TABLET | Freq: Every day | ORAL | Status: DC
  Administered 2024-08-31 – 2024-09-03 (×4): 1 via ORAL
  Filled 2024-08-30 (×4): qty 1

## 2024-08-30 MED ORDER — ENSURE PLUS HIGH PROTEIN PO LIQD
237.0000 mL | Freq: Two times a day (BID) | ORAL | Status: DC
  Administered 2024-08-30 – 2024-09-02 (×6): 237 mL via ORAL

## 2024-08-30 NOTE — Plan of Care (Signed)
   Problem: Education: Goal: Knowledge of General Education information will improve Description: Including pain rating scale, medication(s)/side effects and non-pharmacologic comfort measures Outcome: Progressing   Problem: Activity: Goal: Risk for activity intolerance will decrease Outcome: Progressing   Problem: Nutrition: Goal: Adequate nutrition will be maintained Outcome: Progressing   Problem: Coping: Goal: Level of anxiety will decrease Outcome: Progressing   Problem: Skin Integrity: Goal: Risk for impaired skin integrity will decrease Outcome: Progressing

## 2024-08-30 NOTE — Progress Notes (Signed)
 Consultation Progress Note   Patient: Kelsey West FMW:[id number] DOB: 03/02/1971 DOA: 08/29/2024 DOS: the patient was seen and examined on 08/30/2024 Primary service: Shuntel Fishburn, Landon BRAVO, MD  Brief hospital course:  Kelsey West is a 53 y.o. female with PMH of chronic back pain, fibromyalgia and tobacco use disorder presenting with shortness of breath, congestion and cough.   Patient reports new onset nasal congestion and chest congestion about 4 days ago.  Her daughter had similar symptoms.  She also started having shortness of breath and productive cough with whitish phlegm.  Denies hemoptysis.  No fever or rhinorrhea.  Denies chest pain.  Has tried to manage cough with over-the-counter Mucinex.  She took a daughter to the doctor's office today and noted to have shortness of breath and DOE.  Clinic staff called ambulance and she was brought to ED for further evaluation.  She says her cough is better today.  She also reports improvement in her breathing after treatment in ED.   Patient denies formal diagnosis of COPD but over 30-pack-year history of smoking.  Denies history of asthma, diabetes, hypertension, heart disease or blood clots.   Reports 30-pack-year history of smoking.  Recently cut down to half a pack a day.  Denies alcohol or recreational drug use.     In ED, desaturated to 81% on RA.  Started on 25 L by HFNC.  Tachypneic to 22.  VBG 7.45/51/55/35.4.  K3.2.  Glucose 118.  ALT 48.  WBC 13.3 with left shift.  COVID-19, influenza and RSV PCR nonreactive.  CXR with bibasilar atelectasis.  Received Solu-Medrol , bronchodilators, magnesium and 1 L bolus.  Admission requested respiratory failure in the setting of possible COPD exacerbation.   Assessment and Plan: Acute respiratory failure with hypoxia due to possible COPD exacerbation:  Patient presents with congestion, productive cough, SOB and DOE.  Family member with similar symptoms.  No formal diagnosis of asthma or COPD but  30-pack-year history.   Desaturated to 81% on RA requiring up to 25 L by HFNC to maintain saturation in 90s.  COVID-19, influenza and RSV PCR nonreactive.  CXR with bibasilar atelectasis.  - Received Solu-Medrol , nebs and magnesium in ED -COPD pathway with systemic steroid, scheduled and as needed nebulizers -Mucolytics and antitussive. -Check 20 pathogen RVP, procalcitonin, BNP -Wean oxygen as able. - Encouraged smoking cessation  11/14-Patient feels better today, her oxygen levels were weaned down to 4L from HFNC Her cough has improved, Pro calcitonin is not suggestive of infection, Pro BNP was 252. Would obtain CT chest for further evaluation  of her respiratory failure. Check D-dimer. Bump in WBC count likely due to steroid use. Monitor trend. Continue Breathing treatments, Prednisone 40mg  x 4 days. Would need follow up with pulmonology for PFTs. Smoking cessation discussed. Monitor blood sugar with steroid use, may need sliding scale insulin.    Tobacco use disorder: About 30-pack-year history.  Recently cut down to half a pack a day. - Encouraged smoking cessation - Nicotine patch   Hypokalemia -Monitor replenish K and Mg as appropriate   Elevated ALT: Mild - Recheck in the morning   Fibromyalgia/chronic back pain - As needed pain meds   Medication management - Med rec pending at time of this dictation.      Subjective: Patient seen at bedside, she feels much better compared to time of admission. RN weaned her her down to 4L on [place].  Physical Exam: Vitals:   08/30/24 1200 08/30/24 1211 08/30/24 1212 08/30/24 1219  BP:  138/80     Pulse: 78 76 77 80  Resp: 14 17 17 15   Temp:      TempSrc:      SpO2: 100% 98% 98% 94%  Weight:      Height:       General  No acute Distress Eyes: PERRL, lids and conjunctivae normal ENMT: Mucous membranes are moist.   Neck: normal, supple, no masses, no thyromegaly Respiratory: Bibasilar crackles heard, On supplemental oxygen.   Cardiovascular: Regular rate and rhythm, no murmurs / rubs / gallops Abdomen: Soft, nontender nondistended. Musculoskeletal: no clubbing / cyanosis. No joint deformity upper and lower extremities.  Skin: no rashes, lesions, ulcers. No induration Neurologic: Facial asymmetry, moving extremity spontaneously, speech fluent. Psychiatric: Normal judgment and insight. Alert and oriented x 3. Normal mood.   Data Reviewed:    CBC    Component Value Date/Time   WBC 15.4 (H) 08/30/2024 0321   RBC 4.25 08/30/2024 0321   HGB 13.0 08/30/2024 0321   HGB 11.0 (L) 05/18/2015 1546   HCT 40.1 08/30/2024 0321   HCT 35.4 05/18/2015 1546   PLT 287 08/30/2024 0321   PLT 453 (H) 05/18/2015 1546   MCV 94.4 08/30/2024 0321   MCV 83.3 05/18/2015 1546   MCH 30.6 08/30/2024 0321   MCHC 32.4 08/30/2024 0321   RDW 12.0 08/30/2024 0321   RDW 14.6 (H) 05/18/2015 1546   LYMPHSABS 1.7 08/29/2024 1551   LYMPHSABS 2.5 05/18/2015 1546   MONOABS 1.4 (H) 08/29/2024 1551   MONOABS 0.9 05/18/2015 1546   EOSABS 0.0 08/29/2024 1551   EOSABS 0.2 05/18/2015 1546   BASOSABS 0.1 08/29/2024 1551   BASOSABS 0.0 05/18/2015 1546     CMP     Component Value Date/Time   NA 141 08/30/2024 0321   NA 139 05/18/2015 1546   K 4.4 08/30/2024 0321   K 2.8 (LL) 05/18/2015 1546   CL 105 08/30/2024 0321   CO2 27 08/30/2024 0321   CO2 27 05/18/2015 1546   GLUCOSE 156 (H) 08/30/2024 0321   GLUCOSE 96 05/18/2015 1546   BUN 13 08/30/2024 0321   BUN 6.6 (L) 05/18/2015 1546   CREATININE 0.44 08/30/2024 0321   CREATININE 0.6 05/18/2015 1546   CALCIUM 9.0 08/30/2024 0321   CALCIUM 9.1 05/18/2015 1546   PROT 6.9 08/30/2024 0321   PROT 7.4 05/18/2015 1546   ALBUMIN 3.4 (L) 08/30/2024 0321   ALBUMIN 3.7 05/18/2015 1546   AST 24 08/30/2024 0321   AST 18 05/18/2015 1546   ALT 39 08/30/2024 0321   ALT 14 05/18/2015 1546   ALKPHOS 80 08/30/2024 0321   ALKPHOS 82 05/18/2015 1546   BILITOT 0.6 08/30/2024 0321   BILITOT 0.56  05/18/2015 1546   GFR 110.75 07/02/2014 1108   EGFR >90 05/18/2015 1546   GFRNONAA >60 08/30/2024 0321    Family Communication: Plan discussed at length at bedside, she expressed understanding.  Time spent: 34 minutes.  Author: Landon FORBES Baller, MD 08/30/2024 1:29 PM  For on call review [email or web address].

## 2024-08-30 NOTE — Progress Notes (Signed)
 Initial Nutrition Assessment  DOCUMENTATION CODES:   Not applicable  INTERVENTION:  - Regular diet.  - Ensure Plus High Protein po BID, each supplement provides 350 kcal and 20 grams of protein. - Multivitamin with minerals daily - Monitor weight trends.   NUTRITION DIAGNOSIS:   Increased nutrient needs related to acute illness as evidenced by estimated needs.  GOAL:   Patient will meet greater than or equal to 90% of their needs   MONITOR:   PO intake, Supplement acceptance, Weight trends  REASON FOR ASSESSMENT:   Consult Assessment of nutrition requirement/status  ASSESSMENT:   53 y.o. female with PMH of chronic back pain, fibromyalgia and tobacco use disorder who presented with shortness of breath, congestion and cough. Admitted for acute respiratory failure.  Patient reports a UBW of 155# and feels her weight has been stable.  No weight history since 2022 to assess recent weight trends.   Patient endorses typically eating 3 meals a day at home. Often has a bagel with coffee for breakfast, a sandwich wrap for lunch, and a sandwich wrap or soup for dinner. Was eating normally until 1 week ago when her symptoms began. Over the past week, she has had poor appetite and having to force herself to eat food or drink liquids.   Current appetite remains about the same. Discussed increased nutrient needs and encouraged patient to order and consume 3 meals a day as tolerated. She is agreeable to Ensure to support intake.   Medications reviewed and include: -  Labs reviewed:  - .  NUTRITION - FOCUSED PHYSICAL EXAM:  Flowsheet Row Most Recent Value  Orbital Region No depletion  Upper Arm Region No depletion  Thoracic and Lumbar Region No depletion  Buccal Region Unable to assess  Temple Region Moderate depletion  Clavicle Bone Region No depletion  Clavicle and Acromion Bone Region Mild depletion  Scapular Bone Region Unable to assess  Dorsal Hand No depletion   Patellar Region No depletion  Anterior Thigh Region No depletion  Posterior Calf Region No depletion  Edema (RD Assessment) None  Hair Reviewed  Eyes Reviewed  Mouth Reviewed  Skin Reviewed  Nails Reviewed    Diet Order:   Diet Order             Diet regular Room service appropriate? Yes; Fluid consistency: Thin  Diet effective now                   EDUCATION NEEDS:  Education needs have been addressed  Skin:  Skin Assessment: Reviewed RN Assessment  Last BM:  PTA  Height:  Ht Readings from Last 1 Encounters:  08/29/24 5' 6 (1.676 m)   Weight:  Wt Readings from Last 1 Encounters:  08/29/24 75.4 kg   BMI:  Body mass index is 26.83 kg/m.  Estimated Nutritional Needs:  Kcal:  1900-2100 kcals Protein:  85-100 grams Fluid:  >/= 1.9L    Trude Ned RD, LDN Contact via Secure Chat.

## 2024-08-31 DIAGNOSIS — J441 Chronic obstructive pulmonary disease with (acute) exacerbation: Secondary | ICD-10-CM | POA: Diagnosis not present

## 2024-08-31 LAB — EXPECTORATED SPUTUM ASSESSMENT W GRAM STAIN, RFLX TO RESP C

## 2024-08-31 MED ORDER — GUAIFENESIN ER 600 MG PO TB12
600.0000 mg | ORAL_TABLET | Freq: Two times a day (BID) | ORAL | Status: DC
  Administered 2024-08-31 – 2024-09-03 (×7): 600 mg via ORAL
  Filled 2024-08-31 (×7): qty 1

## 2024-08-31 NOTE — Plan of Care (Signed)
   Problem: Education: Goal: Knowledge of General Education information will improve Description: Including pain rating scale, medication(s)/side effects and non-pharmacologic comfort measures Outcome: Progressing   Problem: Nutrition: Goal: Adequate nutrition will be maintained Outcome: Progressing   Problem: Skin Integrity: Goal: Risk for impaired skin integrity will decrease Outcome: Progressing

## 2024-08-31 NOTE — Hospital Course (Signed)
 53 y.o. F with fibromyalgia, smoking presented with first COPD flare, found to have metapneumovirus.

## 2024-08-31 NOTE — Progress Notes (Signed)
  Progress Note   Patient: Kelsey West FMW:[id number] DOB: 1971/08/22 DOA: 08/29/2024     2 DOS: the patient was seen and examined on 08/31/2024 at 8:40AM      Brief hospital course: 53 y.o. F with fibromyalgia, smoking presented with first COPD flare, found to have metapneumovirus.     Assessment and Plan: Acute respiratory failure with hypoxia due to suspected COPD exacerbation Metapneumovirus infection Presented with respiratory distress, 81% on room air, requiring 25 L high flow nasal cannula.  She is weaning down on oxygen, improving overall. CT chest showed volume loss, reticulonodular opacities, airway plugging, tree-in-bud opacities. - Continue steroids - Continue bronchodilators - Continue mucolytics - Flutter valve -Follow-up CT chest 3 months - Referral for pulmonology    Smoking Cessation recommended  Hypokalemia Resolved  Fibromyalgia No longer on medication  Leukocytosis Due to steroids  Elevated D-dimer Clinically insignificant       Subjective: Patient is gradually improving, she still has a cough, she has no fever, confusion, respiratory distress       Physical Exam: BP 108/68 (BP Location: Left Arm)   Pulse 65   Temp 98 F (36.7 C) (Oral)   Resp 17   Ht 5' 6 (1.676 m)   Wt 75.4 kg   LMP 09/01/2019   SpO2 93%   BMI 26.83 kg/m   Adult female, lying in bed, interactive and appropriate RRR, no murmurs, no peripheral edema Respiratory rate seems normal, she is wheezing bilaterally, overall lung sounds diminished Abdomen soft, no tenderness palpation Attention normal, affect normal, judgment and insight appear normal      Disposition: Status is: Inpatient         Author: Lonni SHAUNNA Dalton, MD 08/31/2024 1:15 PM  For on call review [email or web address].

## 2024-09-01 DIAGNOSIS — J441 Chronic obstructive pulmonary disease with (acute) exacerbation: Secondary | ICD-10-CM | POA: Diagnosis not present

## 2024-09-01 LAB — BASIC METABOLIC PANEL WITH GFR
Anion gap: 6 (ref 5–15)
BUN: 24 mg/dL — ABNORMAL HIGH (ref 6–20)
CO2: 32 mmol/L (ref 22–32)
Calcium: 8.8 mg/dL — ABNORMAL LOW (ref 8.9–10.3)
Chloride: 104 mmol/L (ref 98–111)
Creatinine, Ser: 0.64 mg/dL (ref 0.44–1.00)
GFR, Estimated: >60 mL/min (ref >60)
Glucose, Bld: 88 mg/dL (ref 70–99)
Potassium: 4.6 mmol/L (ref 3.5–5.1)
Sodium: 141 mmol/L (ref 135–145)

## 2024-09-01 LAB — CBC
HCT: 38.6 % (ref 36.0–46.0)
Hemoglobin: 12 g/dL (ref 12.0–15.0)
MCH: 31.3 pg (ref 26.0–34.0)
MCHC: 31.1 g/dL (ref 30.0–36.0)
MCV: 100.8 fL — ABNORMAL HIGH (ref 80.0–100.0)
Platelets: 319 K/uL (ref 150–400)
RBC: 3.83 MIL/uL — ABNORMAL LOW (ref 3.87–5.11)
RDW: 12.1 % (ref 11.5–15.5)
WBC: 12.8 K/uL — ABNORMAL HIGH (ref 4.0–10.5)
nRBC: 0 % (ref 0.0–0.2)

## 2024-09-01 MED ORDER — METHYLPREDNISOLONE SODIUM SUCC 40 MG IJ SOLR
40.0000 mg | Freq: Two times a day (BID) | INTRAMUSCULAR | Status: DC
  Administered 2024-09-01 – 2024-09-03 (×4): 40 mg via INTRAVENOUS
  Filled 2024-09-01 (×4): qty 1

## 2024-09-01 MED ORDER — ALBUTEROL SULFATE (2.5 MG/3ML) 0.083% IN NEBU: 2.5000 mg | INHALATION_SOLUTION | RESPIRATORY_TRACT | Status: DC | PRN

## 2024-09-01 MED ORDER — PANTOPRAZOLE SODIUM 40 MG PO TBEC
40.0000 mg | DELAYED_RELEASE_TABLET | Freq: Two times a day (BID) | ORAL | Status: DC
  Administered 2024-09-01 – 2024-09-03 (×5): 40 mg via ORAL
  Filled 2024-09-01 (×5): qty 1

## 2024-09-01 NOTE — Plan of Care (Signed)
  Problem: Education: Goal: Knowledge of General Education information will improve Description: Including pain rating scale, medication(s)/side effects and non-pharmacologic comfort measures Outcome: Progressing   Problem: Clinical Measurements: Goal: Diagnostic test results will improve Outcome: Progressing   Problem: Activity: Goal: Risk for activity intolerance will decrease Outcome: Progressing   Problem: Nutrition: Goal: Adequate nutrition will be maintained Outcome: Progressing   Problem: Safety: Goal: Ability to remain free from injury will improve Outcome: Progressing

## 2024-09-01 NOTE — Progress Notes (Signed)
  Progress Note   Patient: Kelsey West FMW:[id number] DOB: July 15, 1971 DOA: 08/29/2024     3 DOS: the patient was seen and examined on 09/01/2024 at 9:03 AM      Brief hospital course: 53 y.o. F with fibromyalgia, smoking presented with first COPD flare, found to have metapneumovirus.     Assessment and Plan: Acute respiratory failure with hypoxia due to suspected COPD exacerbation Metapneumovirus infection See summary from 11/15 Still requiring 5 L oxygen, desaturates to 87% on 4 L. - Continue Solu-Medrol  - Continue scheduled and as needed bronchodilators - Continue mucolytics - Flutter valve - Follow-up CT chest in 90 days - Plan for outpatient pulmonology referral        Smoking Cessation recommended   See 11/15 note for further diagnoses            Subjective: No significant change.  She does not have shortness of breath at rest, but she desaturates with any movement.  Does not use oxygen at home.     Physical Exam: BP 123/86 (BP Location: Left Arm)   Pulse (!) 59   Temp 97.7 F (36.5 C) (Oral)   Resp 16   Ht 5' 6 (1.676 m)   Wt 75.4 kg   LMP 09/01/2019   SpO2 95%   BMI 26.83 kg/m   Adult female, lying in bed, interactive and appropriate RRR, no murmurs, no peripheral edema She is breathing heavily and appears to be short of breath at rest.  She is wheezing bilaterally. Abdomen soft, no tenderness palpation Attention normal, affect normal, judgment and insight appear normal    Data Reviewed: Basic metabolic panel shows elevated BUN, normal creatinine and electrolytes CBC shows white count down to 12    Family Communication:     Disposition: Status is: Inpatient Still with high oxygen requirements, 5-6 L at rest        Author: Lonni SHAUNNA Dalton, MD 09/01/2024 10:30 AM  For on call review [email or web address].

## 2024-09-02 DIAGNOSIS — J441 Chronic obstructive pulmonary disease with (acute) exacerbation: Secondary | ICD-10-CM | POA: Diagnosis not present

## 2024-09-02 NOTE — Plan of Care (Signed)

## 2024-09-02 NOTE — Progress Notes (Signed)
  Progress Note   Patient: Kelsey West FMW:[id number] DOB: 29-Oct-1970 DOA: 08/29/2024     4 DOS: the patient was seen and examined on 09/02/2024 at 8:42AM      Brief hospital course: 53 y.o. F with fibromyalgia, smoking presented with first COPD flare, found to have metapneumovirus.     Assessment and Plan: Acute respiratory with hypoxia failure due to suspected underlying COPD cute exacerbation Metapneumovirus pneumonia Wean down to 3 L today, O2 saturation 89% on 3 L - Continue Solu-Medrol  twice daily - Continue scheduled and as needed bronchodilators - Continue mucolytics, flutter valve - Follow-up CT chest in 3 months - Plan for outpatient pulmonology referral  Smoking Cessation recommended  Hypokalemia Resolved  Macrocytosis Follow-up with PCP  Fibromyalgia No longer on medication  Elevated D-dimer Due to COPD, low suspicion for PE       Subjective: Patient is gradually improving.  She is coughing frequently, but feeling overall better.  Still requiring lots of oxygen, not on oxygen at home     Physical Exam: BP 139/79 (BP Location: Left Arm)   Pulse (!) 59   Temp 97.8 F (36.6 C) (Oral)   Resp 19   Ht 5' 6 (1.676 m)   Wt 75.4 kg   LMP 09/01/2019   SpO2 94%   BMI 26.83 kg/m   Adult female, standing at the sink, brushing her teeth Heart rate normal, no murmurs, no peripheral edema Respiratory rate seems increased with exertion, lung sounds diminished bilaterally, poor air movement Attention normal, affect appropriate, judgment and insight appear normal, face symmetric, speech fluent, gait normal, upper and lower extremity strength seems normal and symmetric       Disposition: Status is: Inpatient         Author: Lonni SHAUNNA Dalton, MD 09/02/2024 12:42 PM  For on call review [email or web address].

## 2024-09-02 NOTE — TOC Initial Note (Signed)
 Transition of Care [facility]) - Initial/Assessment Note   Patient Details  Name: Kelsey West MRN: [id number] Date of Birth: [birth year]  Transition of Care [facility]) CM/SW Contact:    Duwaine GORMAN Aran, LCSW Phone Number: 09/02/2024, 3:45 PM  Clinical Narrative: CSW met with patient to discuss homelessness consult. Per patient, she is currently homeless but her mother is paying for her to stay at the [facility] motel [place] the [place] after discharge and mother plans to transport her there.  Expected Discharge Plan:  ([facility]) Barriers to Discharge: Continued Medical Work up  Patient Goals and CMS Choice Patient states their goals for this hospitalization and ongoing recovery are:: Discharge to [facility] Choice offered to / list presented to : NA  Expected Discharge Plan and Services In-house Referral: Clinical Social Work Post Acute Care Choice: NA Living arrangements for the past 2 months: Homeless           DME Arranged: N/A DME Agency: NA  Prior Living Arrangements/Services Living arrangements for the past 2 months: Homeless Lives with:: Self Patient language and need for interpreter reviewed:: Yes Do you feel safe going back to the place where you live?: Yes      Need for Family Participation in Patient Care: No (Comment) Care giver support system in place?: Yes (comment) Criminal Activity/Legal Involvement Pertinent to Current Situation/Hospitalization: No - Comment as needed  Activities of Daily Living ADL Screening (condition at time of admission) Independently performs ADLs?: Yes (appropriate for developmental age) Is the patient deaf or have difficulty hearing?: No Does the patient have difficulty seeing, even when wearing glasses/contacts?: No Does the patient have difficulty concentrating, remembering, or making decisions?: No  Emotional Assessment Attitude/Demeanor/Rapport: Engaged Affect (typically observed): Accepting Orientation: : Oriented to  Self, Oriented to Place, Oriented to  Time, Oriented to Situation Alcohol / Substance Use: Not Applicable Psych Involvement: No (comment)  Admission diagnosis:  Hypoxia [R09.02] COPD with acute exacerbation (HCC) [J44.1] Chronic obstructive pulmonary disease with acute exacerbation (HCC) [J44.1] Patient Active Problem List   Diagnosis Date Noted   COPD with acute exacerbation (HCC) 08/29/2024   Dog bite of hand, right, initial encounter 10/29/2019   Fibromyalgia syndrome 02/11/2014   Leukocytosis 11/26/2013   Encounter for Pap smear 06/10/2013   Chronic back pain 05/16/2013   Sciatica of right side 05/16/2013   PCP:  Pcp, No Pharmacy:   [id number] - [place], [place] - [address] AT [address] OF [facility] [address] [place] [address] Phone: [phone] Fax: [phone]  Social Drivers of Health (SDOH) Social History: SDOH Screenings   Food Insecurity: No Food Insecurity (08/29/2024)  Housing: High Risk (08/29/2024)  Transportation Needs: No Transportation Needs (08/29/2024)  Utilities: Not At Risk (08/29/2024)  Social Connections: Unknown (08/29/2024)  Tobacco Use: High Risk (08/29/2024)   SDOH Interventions:    Readmission Risk Interventions     No data to display

## 2024-09-03 ENCOUNTER — Other Ambulatory Visit (HOSPITAL_COMMUNITY): Payer: Self-pay

## 2024-09-03 DIAGNOSIS — J441 Chronic obstructive pulmonary disease with (acute) exacerbation: Secondary | ICD-10-CM | POA: Diagnosis not present

## 2024-09-03 LAB — BASIC METABOLIC PANEL WITH GFR
Anion gap: 9 (ref 5–15)
BUN: 17 mg/dL (ref 6–20)
CO2: 29 mmol/L (ref 22–32)
Calcium: 9.4 mg/dL (ref 8.9–10.3)
Chloride: 102 mmol/L (ref 98–111)
Creatinine, Ser: 0.48 mg/dL (ref 0.44–1.00)
GFR, Estimated: >60 mL/min (ref >60)
Glucose, Bld: 102 mg/dL — ABNORMAL HIGH (ref 70–99)
Potassium: 3.9 mmol/L (ref 3.5–5.1)
Sodium: 140 mmol/L (ref 135–145)

## 2024-09-03 LAB — CBC
HCT: 39.2 % (ref 36.0–46.0)
Hemoglobin: 12.9 g/dL (ref 12.0–15.0)
MCH: 31.2 pg (ref 26.0–34.0)
MCHC: 32.9 g/dL (ref 30.0–36.0)
MCV: 94.9 fL (ref 80.0–100.0)
Platelets: 391 K/uL (ref 150–400)
RBC: 4.13 MIL/uL (ref 3.87–5.11)
RDW: 12.1 % (ref 11.5–15.5)
WBC: 17.1 K/uL — ABNORMAL HIGH (ref 4.0–10.5)
nRBC: 0 % (ref 0.0–0.2)

## 2024-09-03 MED ORDER — NICOTINE 21 MG/24HR TD PT24: 21.0000 mg | MEDICATED_PATCH | Freq: Every day | TRANSDERMAL | Status: AC

## 2024-09-03 MED ORDER — PREDNISONE 10 MG PO TABS
ORAL_TABLET | ORAL | 0 refills | Status: AC
  Filled 2024-09-03: qty 18, 9d supply, fill #0

## 2024-09-03 MED ORDER — ALBUTEROL SULFATE HFA 108 (90 BASE) MCG/ACT IN AERS
2.0000 | INHALATION_SPRAY | Freq: Four times a day (QID) | RESPIRATORY_TRACT | 2 refills | Status: DC | PRN
  Filled 2024-09-03: qty 6.7, 25d supply, fill #0

## 2024-09-03 NOTE — Progress Notes (Addendum)
SATURATION QUALIFICATIONS: (This note is used to comply with regulatory documentation for home oxygen)  Patient Saturations on Room Air at Rest = 86%  Patient Saturations on Room Air while Ambulating = 83%  Patient Saturations on 4 Liters of oxygen while Ambulating = 91%  Please briefly explain why patient needs home oxygen:

## 2024-09-03 NOTE — Progress Notes (Signed)
 Discharge on hold due to patient not having a permanent address. Home O2 needs to go to a permanent address.

## 2024-09-03 NOTE — TOC Transition Note (Signed)
 Transition of Care [facility]) - Discharge Note  Patient Details  Name: Kelsey West MRN: [id number] Date of Birth: Aug 16, 1971  Transition of Care [facility]) CM/SW Contact:  Duwaine GORMAN Aran, LCSW Phone Number: 09/03/2024, 11:57 AM  Clinical Narrative: Patient will need to discharge on home oxygen and patient does not have DME company preference. CSW made home oxygen referral to [facility] with [employer]. [employer] to deliver travel tank to patient's room. Care management signing off.  Final next level of care: Home/Self Care Barriers to Discharge: Barriers Resolved  Patient Goals and CMS Choice Patient states their goals for this hospitalization and ongoing recovery are:: Discharge to [employer] motel CMS Medicare.gov Compare Post Acute Care list provided to:: Patient Choice offered to / list presented to : Patient  Discharge Plan and Services Additional resources added to the After Visit Summary for   In-house Referral: Clinical Social Work Post Acute Care Choice: NA          DME Arranged: Oxygen DME Agency: [employer] Date DME Agency Contacted: 09/03/24 Time DME Agency Contacted: [id number] Representative spoke with at DME Agency: Mitch  Social Drivers of Health (SDOH) Interventions SDOH Screenings   Food Insecurity: No Food Insecurity (08/29/2024)  Housing: High Risk (08/29/2024)  Transportation Needs: No Transportation Needs (08/29/2024)  Utilities: Not At Risk (08/29/2024)  Social Connections: Unknown (08/29/2024)  Tobacco Use: High Risk (08/29/2024)   Readmission Risk Interventions     No data to display

## 2024-09-03 NOTE — Progress Notes (Signed)
 Discharge medications delivered to patient at the bedside in a secure bag.

## 2024-09-03 NOTE — Plan of Care (Signed)
   Problem: Clinical Measurements: Goal: Respiratory complications will improve Outcome: Progressing   Problem: Safety: Goal: Ability to remain free from injury will improve Outcome: Progressing

## 2024-09-03 NOTE — Discharge Summary (Signed)
 Physician Discharge Summary   Patient: Kelsey West MRN: [id number] DOB: [birth year]  Admit date:     08/29/2024  Discharge date: 09/03/24  Discharge Physician: Lonni SHAUNNA Dalton   PCP: Pcp, No     Recommendations at discharge:  Follow up with [facility] as soon as able for suspected COPD, metapneumovirus and new O2 requirement [facility]: Please obtain PFTs after resolution of current illness Please wean O2 as able Obtain CT chest in 3 months to document resolution Repeat CBC with new PCP in December          Hospital Course: 53 y.o. F with fibromyalgia, smoking presented with first COPD flare, found to have metapneumovirus.  Had developed URI symptoms from her daughter few days prior to admission.  Cough progressively worse, until she became short of breath and distress, oxygen saturation 81% on room air.  Started on 25 L high flow nasal cannula.   Acute respiratory with hypoxia failure due to suspected underlying COPD acute exacerbation Metapneumovirus pneumonia Admitted in respiratory failure.  Started on steroids, bronchodilators.  Given her history of smoking, underlying COPD was suspected.    She was eventually weaned down to 1 L of oxygen, but still desaturated with ambulation.  Discharged with home oxygen, referral to pulmonology.  9-day steroid taper, given albuterol prescription.  Needs follow-up CT chest in 3 months   Smoking Cessation recommended, modalities discussed   Hypokalemia Resolved   Macrocytosis Follow-up with PCP   Fibromyalgia No longer on medication   Elevated D-dimer Due to COPD, low suspicion for PE           Disposition: Home Diet recommendation:  Regular diet  DISCHARGE MEDICATION: Allergies as of 09/03/2024   No Known Allergies      Medication List     TAKE these medications    albuterol 108 (90 Base) MCG/ACT inhaler Commonly known as: VENTOLIN HFA Inhale 2 puffs into the lungs  every 6 (six) hours as needed for wheezing or shortness of breath.   nicotine 21 mg/24hr patch Commonly known as: NICODERM CQ - dosed in mg/24 hours Place 1 patch (21 mg total) onto the skin daily. Start taking on: September 04, 2024   predniSONE 10 MG tablet Commonly known as: DELTASONE Take 3 tablets (30 mg total) by mouth daily for 3 days, THEN 2 tablets (20 mg total) daily for 3 days, THEN 1 tablet (10 mg total) daily for 3 days. Start taking on: September 03, 2024               Durable Medical Equipment  (From admission, onward)           Start     Ordered   09/03/24 1110  DME Oxygen  (Discharge Planning)  Once       Question Answer Comment  Length of Need 6 Months   Mode or (Route) Nasal cannula   Liters per Minute 4   Frequency Continuous (stationary and portable oxygen unit needed)   Oxygen delivery system: Gas   Oxygen delivery system: Concentrator   Oxygen delivery system: Gas/Tank      09/03/24 1109   08/29/24 0000  For home use only DME oxygen       Question Answer Comment  Length of Need 6 Months   Liters per Minute 3   Oxygen delivery system: Gas      08/29/24 1724            Follow-up Information     [facility] Follow up.   Specialty: Pulmonology Contact information: [address] [place] [place]  [phone] Additional information: [address]  [address]  [place], [place] [address]                Discharge Instructions     Discharge instructions   Complete by: As directed    **IMPORTANT DISCHARGE INSTRUCTIONS**   From Dr. Jonel:  You were admitted for metapneumovirus pneumonia and probably a COPD flare  Here, you were treated with steroids to improve the COPD portion You should finish the course with a 9 days steroid taper with prednisone Take prednisone 30 mg (three tabs) daily with breakfast for the next three days (Weds, Thur Fri) then Take  prednisone 20 mg (two tabs) daily (Sat, Sun, Mon) then Take prednisone 10 mg (1 tab) daily (Tues, Weds, [place] next week then stop)  Use the albuterol 3 times daily for the next week, then reduce to as needed use  STOP smoking No smoking at all Use a nicotine patch and gum to reduce cravings.  I have sent a referral to Pulmonology Call the number listed below if you haven't heard from someone in 1 week They should do pulmonary function tests for you  Go see the new primary doctor in December  If you have any lingering cough, you should take the cough syrup we gave you here, Robitussin (with the ingredients GUIAFENESIN and DEXTROMETHORPHAN)  Use the oxygen all the time until you see the new pulmonary doctor or new primary care doctor The purpose of the  oxygen is to keep your oxygen level at 88% or above  You should purchase a pulse oximeter at your pharmacy. This is a device that you put on your finger to measure your oxygen level.  They are available at any pharmacy.  Use it to check your oxygen level twice daily until you see your primary care doctor or the pulmonary  If your oxygen level is ever LESS than 88% and doesn't get better within a minute or so with putting the oxygen on, you should come to the ER.  Lastly, you should have a repeat CT chest imaging in 3 months to make sure all the changes here are resolved   For home use only DME oxygen   Complete by: As directed    Length of Need: 6 Months   Liters per Minute: 3   Oxygen delivery system: Gas   Increase activity slowly   Complete by: As directed    Pulmonary Visit   Complete by: As directed    Bad COPD New Oxygen, needs PFTs   Reason for referral: Other Pulmonary       Discharge Exam: Filed Weights   08/29/24 1607 08/29/24 2107  Weight: 68 kg 75.4 kg    General: Pt is alert, awake, not in acute distress Cardiovascular: RRR, nl S1-S2, no murmurs appreciated.   No LE edema.   Respiratory: Normal  respiratory rate and rhythm.  Diminished but no rales appreciated Abdominal: Abdomen soft and non-tender.  No distension or HSM.   Neuro/Psych: Strength symmetric in upper and lower extremities.  Judgment and insight appear normal.   Condition at discharge: fair  The results of significant diagnostics from this hospitalization (including imaging, microbiology, ancillary and laboratory) are listed below for reference.   Imaging Studies: CT CHEST WO CONTRAST Result Date: 08/30/2024 EXAM: CT CHEST WITHOUT CONTRAST 08/30/2024 02:12:00 PM TECHNIQUE: CT  of the chest was performed without the administration of intravenous contrast. Multiplanar reformatted images are provided for review. Automated exposure control, iterative reconstruction, and/or weight based adjustment of the mA/kV was utilized to reduce the radiation dose to as low as reasonably achievable. COMPARISON: 02/20/2017. 0.7 cm prevascular node on image 36 series 3 within normal size limits. CLINICAL HISTORY: Pneumonia suspected, uncomplicated. FINDINGS: MEDIASTINUM: Heart and pericardium are unremarkable. The central airways are clear. LYMPH NODES: No mediastinal, hilar or axillary lymphadenopathy. LUNGS AND PLEURA: Centrilobular emphysema. Subsegmental atelectasis or scarring anteriorly in the right upper lobe. Volume loss with reticulonodular opacity, airway plugging, and atelectasis in the right middle lobe. Confluent airway plugging and bandlike airspace opacities with confluent bandlike components in the right lower lobe suspicious for atelectasis with superimposed pneumonia. Tree in bud and reticulonodular opacities in the lingula are new and favor atypical infectious bronchiolitis given the pattern; 1 of the largest individual nodules measures 9 x 7 mm on image 97 of series 9. Mild confluent airspace opacity inferiorly in the lingula. Airway thickening but scattered atelectasis in the left lower lobe inferiorly. No pleural effusion or  pneumothorax. SOFT TISSUES/BONES: No acute abnormality of the bones or soft tissues. UPPER ABDOMEN: Small to moderate sized hiatal hernia with adjacent postoperative clips. Limited images of the upper abdomen demonstrates no acute abnormality. IMPRESSION: 1. Right lower lobe atelectasis with superimposed pneumonia. Airway plugging in the right lower lobe. 2. New tree-in-bud and reticulonodular opacities in the lingula, favor atypical infectious bronchiolitis. Consider surveillance imaging in 3 months to ensure resolution after therapy. 3. Centrilobular emphysema. 4. Airway thickening suggests bronchitis or reactive airways disease. Electronically signed by: Ryan Salvage MD 08/30/2024 02:51 PM EST RP Workstation: [id number]   DG Chest Port 1 View Result Date: 08/29/2024 CLINICAL DATA:  Shortness of breath. EXAM: PORTABLE CHEST 1 VIEW COMPARISON:  Chest radiograph dated 06/11/2023. FINDINGS: Bibasilar atelectasis. No focal consolidation, pleural effusion, pneumothorax. The cardiac silhouette is within normal limits. No acute osseous pathology. IMPRESSION: Bibasilar atelectasis. No focal consolidation. Electronically Signed   By: Vanetta Chou M.D.   On: 08/29/2024 16:11    Microbiology: Results for orders placed or performed during the hospital encounter of 08/29/24  Resp panel by RT-PCR (RSV, Flu A&B, Covid) Anterior Nasal Swab     Status: None   Collection Time: 08/29/24  4:57 PM   Specimen: Anterior Nasal Swab  Result Value Ref Range Status   SARS Coronavirus 2 by RT PCR NEGATIVE NEGATIVE Final    Comment: (NOTE) SARS-CoV-2 target nucleic acids are NOT DETECTED.  The SARS-CoV-2 RNA is generally detectable in upper respiratory specimens during the acute phase of infection. The lowest concentration of SARS-CoV-2 viral copies this assay can detect is 138 copies/mL. A negative result does not preclude SARS-Cov-2 infection and should not be used as the sole basis for treatment or other  patient management decisions. A negative result may occur with  improper specimen collection/handling, submission of specimen other than nasopharyngeal swab, presence of viral mutation(s) within the areas targeted by this assay, and inadequate number of viral copies(<138 copies/mL). A negative result must be combined with clinical observations, patient history, and epidemiological information. The expected result is Negative.  Fact Sheet for Patients:  bloggercourse.com  Fact Sheet for Healthcare Providers:  seriousbroker.it  This test is no t yet approved or cleared by the [place]  FDA and  has been authorized for detection and/or diagnosis of SARS-CoV-2 by FDA under an Emergency Use Authorization (EUA). This EUA will remain  in effect (meaning this test can be used) for the duration of the COVID-19 declaration under Section 564(b)(1) of the Act, 21 U.S.C.section 360bbb-3(b)(1), unless the authorization is terminated  or revoked sooner.       Influenza A by PCR NEGATIVE NEGATIVE Final   Influenza B by PCR NEGATIVE NEGATIVE Final    Comment: (NOTE) The Xpert Xpress SARS-CoV-2/FLU/RSV plus assay is intended as an aid in the diagnosis of influenza from Nasopharyngeal swab specimens and should not be used as a sole basis for treatment. Nasal washings and aspirates are unacceptable for Xpert Xpress SARS-CoV-2/FLU/RSV testing.  Fact Sheet for Patients: bloggercourse.com  Fact Sheet for Healthcare Providers: seriousbroker.it  This test is not yet approved or cleared by the [place]  FDA and has been authorized for detection and/or diagnosis of SARS-CoV-2 by FDA under an Emergency Use Authorization (EUA). This EUA will remain in effect (meaning this test can be used) for the duration of the COVID-19 declaration under Section 564(b)(1) of the Act, 21 U.S.C. section  360bbb-3(b)(1), unless the authorization is terminated or revoked.     Resp Syncytial Virus by PCR NEGATIVE NEGATIVE Final    Comment: (NOTE) Fact Sheet for Patients: bloggercourse.com  Fact Sheet for Healthcare Providers: seriousbroker.it  This test is not yet approved or cleared by the [place]  FDA and has been authorized for detection and/or diagnosis of SARS-CoV-2 by FDA under an Emergency Use Authorization (EUA). This EUA will remain in effect (meaning this test can be used) for the duration of the COVID-19 declaration under Section 564(b)(1) of the Act, 21 U.S.C. section 360bbb-3(b)(1), unless the authorization is terminated or revoked.  Performed at [facility], [address], [place], [place] [address]   Respiratory (~20 pathogens) panel by PCR     Status: Abnormal   Collection Time: 08/29/24  7:01 PM   Specimen: Nasopharyngeal Swab; Respiratory  Result Value Ref Range Status   Adenovirus NOT DETECTED NOT DETECTED Final   Coronavirus 229E NOT DETECTED NOT DETECTED Final    Comment: (NOTE) The Coronavirus on the Respiratory Panel, DOES NOT test for the novel  Coronavirus (2019 nCoV)    Coronavirus HKU1 NOT DETECTED NOT DETECTED Final   Coronavirus NL63 NOT DETECTED NOT DETECTED Final   Coronavirus OC43 NOT DETECTED NOT DETECTED Final   Metapneumovirus DETECTED (A) NOT DETECTED Final   Rhinovirus / Enterovirus NOT DETECTED NOT DETECTED Final   Influenza A NOT DETECTED NOT DETECTED Final   Influenza B NOT DETECTED NOT DETECTED Final   Parainfluenza Virus 1 NOT DETECTED NOT DETECTED Final   Parainfluenza Virus 2 NOT DETECTED NOT DETECTED Final   Parainfluenza Virus 3 NOT DETECTED NOT DETECTED Final   Parainfluenza Virus 4 NOT DETECTED NOT DETECTED Final   Respiratory Syncytial Virus NOT DETECTED NOT DETECTED Final   Bordetella pertussis NOT DETECTED NOT DETECTED Final   Bordetella  Parapertussis NOT DETECTED NOT DETECTED Final   Chlamydophila pneumoniae NOT DETECTED NOT DETECTED Final   Mycoplasma pneumoniae NOT DETECTED NOT DETECTED Final    Comment: Performed at [facility], [address], [place], [place] [address]  MRSA Next Gen by PCR, Nasal     Status: None   Collection Time: 08/29/24  9:01 PM   Specimen: Nasal Mucosa; Nasal Swab  Result Value Ref Range Status   MRSA by PCR Next Gen NOT DETECTED NOT DETECTED Final    Comment: (NOTE) The GeneXpert MRSA Assay (FDA approved for NASAL specimens only), is one component of  a comprehensive MRSA colonization surveillance program. It is not intended to diagnose MRSA infection nor to guide or monitor treatment for MRSA infections. Test performance is not FDA approved in patients less than 27 years old. Performed at [facility], [address], [place], [place] [address]   Expectorated Sputum Assessment w Gram Stain, Rflx to Resp Cult     Status: None   Collection Time: 08/30/24  5:22 PM   Specimen: Expectorated Sputum  Result Value Ref Range Status   Specimen Description EXPECTORATED SPUTUM  Final   Special Requests NONE  Final   Sputum evaluation   Final    THIS SPECIMEN IS ACCEPTABLE FOR SPUTUM CULTURE Performed at [facility], [address] [place], [place] [address]    Report Status 08/31/2024 FINAL  Final  Culture, Respiratory w Gram Stain     Status: None (Preliminary result)   Collection Time: 08/30/24  5:22 PM  Result Value Ref Range Status   Specimen Description   Final    EXPECTORATED SPUTUM Performed at [facility], [address] [place], [place] [address]    Special Requests   Final    NONE Reflexed from [id number] Performed at [facility], [address], [place], [place] [address]    Gram Stain   Final    FEW WBC PRESENT, PREDOMINANTLY PMN FEW GRAM POSITIVE COCCI FEW BUDDING YEAST SEEN FEW GRAM POSITIVE  RODS    Culture   Final    FEW YEAST CULTURE REINCUBATED FOR BETTER GROWTH Performed at [facility], [address], [place], [place] [address]    Report Status PENDING  Incomplete    Labs: CBC: Recent Labs  Lab 08/29/24 1551 08/30/24 0321 09/01/24 0542 09/03/24 0539  WBC 13.3* 15.4* 12.8* 17.1*  NEUTROABS 10.0*  --   --   --   HGB 14.5 13.0 12.0 12.9  HCT 44.1 40.1 38.6 39.2  MCV 94.6 94.4 100.8* 94.9  PLT 300 287 319 391   Basic Metabolic Panel: Recent Labs  Lab 08/29/24 1551 08/30/24 0321 09/01/24 0542 09/03/24 0539  NA 140 141 141 140  K 3.2* 4.4 4.6 3.9  CL 99 105 104 102  CO2 31 27 32 29  GLUCOSE 118* 156* 88 102*  BUN 14 13 24* 17  CREATININE 0.57 0.44 0.64 0.48  CALCIUM 9.2 9.0 8.8* 9.4  MG 2.2  --   --   --    Liver Function Tests: Recent Labs  Lab 08/29/24 1551 08/30/24 0321  AST 32 24  ALT 48* 39  ALKPHOS 92 80  BILITOT 1.0 0.6  PROT 7.7 6.9  ALBUMIN 3.7 3.4*   CBG: No results for input(s): GLUCAP in the last 168 hours.  Discharge time spent: approximately 45 minutes spent on discharge counseling, evaluation of patient on day of discharge, and coordination of discharge planning with nursing, social work, pharmacy and case management  Signed: Lonni SHAUNNA Dalton, MD [facility] 09/03/2024

## 2024-09-04 LAB — CULTURE, RESPIRATORY W GRAM STAIN: Culture: NORMAL

## 2024-09-09 NOTE — Progress Notes (Signed)
 "  New Patient Pulmonology Office Visit   Subjective:  Patient ID: Kelsey West, female    DOB: 1971-08-02  MRN: [id number]  Referred by: Jonel Lonni SHAUNNA DEWAINE  CC:  Chief Complaint  Patient presents with   Consult    COPD- Pt doing better w/ occ. Chest discomfort & coughing.  ED 11/13.      Discussed the use of AI scribe software for clinical note transcription with the patient, who gave verbal consent to proceed.  History of Present Illness Kelsey West is a 53 year old female former smoker, who presented after recent admission 2/2 to COPD exacerbation and +ve metapneumovirus.   For the past two weeks she has had shortness of breath that led to hospitalization for suspected pneumonia and possible COPD.  She was discharged one week ago on home oxygen, 3L which she is using for the first time. At home, without oxygen, her saturation drops to about 90 and has not been below 88.  She smoked about one pack per day and quit the Thursday before hospitalization. She is using nicotine  patches and has cravings, mainly in the morning with coffee, but has not resumed smoking.  In the hospital she was treated with antibiotics, prednisone , and an albuterol  inhaler, which improved her breathing, though she still feels somewhat tired.  She has had dyspnea with moderate exertion, chronic cough and mild sputum production before admission but this was her first hospitalization. Her shortness of breath is variable and worsens with exertion such as grocery shopping or walking. She avoids long walks because of dyspnea. She has a cough with occasional mucus, which increased during her recent illness.  She has never used an inhaler prior, or being diagnosed with asthma, COPD.   ROS as above.  Allergies: Patient has no known allergies.  Current Outpatient Medications:    albuterol  (VENTOLIN  HFA) 108 (90 Base) MCG/ACT inhaler, Inhale 2 puffs into the lungs every 6 (six) hours as needed for  wheezing or shortness of breath., Disp: 6.7 g, Rfl: 2   nicotine  (NICODERM CQ  - DOSED IN MG/24 HOURS) 21 mg/24hr patch, Place 1 patch (21 mg total) onto the skin daily., Disp: , Rfl:    predniSONE  (DELTASONE ) 10 MG tablet, Take 3 tablets (30 mg total) by mouth daily for 3 days, THEN 2 tablets (20 mg total) daily for 3 days, THEN 1 tablet (10 mg total) daily for 3 days., Disp: 18 tablet, Rfl: 0 Past Medical History:  Diagnosis Date   Arthritis    Chronic back pain    Degenerative disc disease, lumbar    Fibromyalgia 01/2014   Incisional hernia    Smoker    Past Surgical History:  Procedure Laterality Date   APPENDECTOMY     HERNIA REPAIR     HIATAL HERNIA REPAIR  2001   TUBAL LIGATION     TUMOR REMOVAL     from right pinky finger   Family History  Problem Relation Age of Onset   Thyroid disease Mother    Other Brother        Polyarteritis nodosa   Thyroid disease Paternal Grandmother    Cancer Paternal Grandmother    Breast cancer Paternal Uncle    Social History   Socioeconomic History   Marital status: Married    Spouse name: Not on file   Number of children: 3   Years of education: 14   Highest education level: Not on file  Occupational History   Occupation: Applied disability  Tobacco Use   Smoking status: Every Day    Current packs/day: 0.50    Average packs/day: 0.5 packs/day for 30.0 years (15.0 ttl pk-yrs)    Types: Cigarettes   Smokeless tobacco: Never  Vaping Use   Vaping status: Never Used  Substance and Sexual Activity   Alcohol use: No    Comment: one beer every 6 months   Drug use: No   Sexual activity: Not Currently  Other Topics Concern   Not on file  Social History Narrative   Not on file   Social Drivers of Health   Financial Resource Strain: Not on file  Food Insecurity: No Food Insecurity (08/29/2024)   Hunger Vital Sign    Worried About Running Out of Food in the Last Year: Never true    Ran Out of Food in the Last Year: Never true   Transportation Needs: No Transportation Needs (08/29/2024)   PRAPARE - Administrator, Civil Service (Medical): No    Lack of Transportation (Non-Medical): No  Physical Activity: Not on file  Stress: Not on file  Social Connections: Unknown (08/29/2024)   Social Connection and Isolation Panel    Frequency of Communication with Friends and Family: Patient unable to answer    Frequency of Social Gatherings with Friends and Family: Patient unable to answer    Attends Religious Services: Patient unable to answer    Active Member of Clubs or Organizations: No    Attends Banker Meetings: Never    Marital Status: Married  Catering Manager Violence: Not At Risk (08/29/2024)   Humiliation, Afraid, Rape, and Kick questionnaire    Fear of Current or Ex-Partner: No    Emotionally Abused: No    Physically Abused: No    Sexually Abused: No       Objective:  LMP 09/01/2019  Wt Readings from Last 3 Encounters:  09/10/24 165 lb 3.2 oz (74.9 kg)  08/29/24 166 lb 3.6 oz (75.4 kg)  07/30/21 157 lb (71.2 kg)   BMI Readings from Last 3 Encounters:  09/10/24 26.66 kg/m  08/29/24 26.83 kg/m  07/30/21 25.34 kg/m   SpO2 Readings from Last 3 Encounters:  09/10/24 98%  09/03/24 91%  06/11/23 94%   98% on 3L O2 Physical Exam Constitutional:      Appearance: Normal appearance.  HENT:     Head: Normocephalic.  Eyes:     Extraocular Movements: Extraocular movements intact.     Pupils: Pupils are equal, round, and reactive to light.  Cardiovascular:     Rate and Rhythm: Normal rate and regular rhythm.  Pulmonary:     Effort: Pulmonary effort is normal.     Comments: Decreased breath sounds throughout. No crackles, no wheezing Musculoskeletal:        General: Normal range of motion.  Neurological:     General: No focal deficit present.     Mental Status: She is alert and oriented to person, place, and time.     CT chest wo con 08/30/24 1. Right lower lobe  atelectasis with superimposed pneumonia. Airway plugging in the right lower lobe. 2. New tree-in-bud and reticulonodular opacities in the lingula, favor atypical infectious bronchiolitis. Consider surveillance imaging in 3 months to ensure resolution after therapy. 3. Centrilobular emphysema. 4. Airway thickening suggests bronchitis or reactive airways disease.    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 CT showed lower lobe opacities consistent with pneumonia. Suspected COPD with emphysema based on smoking history and symptoms. CT showed mild emphysema.  - PFT - Prescribe Breztri  inhaler, two puffs in the morning and two puffs at night. - Educate on inhaler use and potential side effects, including thrush. - Prescribe albuterol  inhaler for as-needed use for shortness of breath. - Continue supplemental oxygen as needed, especially during exertion. - Monitor oxygen saturation at rest and during activity.  Pulmonary nodule, follow-up planned Pulmonary nodule identified on previous imaging, possibly related to infection. Follow-up needed to rule out malignancy due to smoking history. - Repeat CT scan in three months to monitor pulmonary nodule. - Discuss potential for lung cancer screening program based on smoking history.  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 She quit smoking since hospitalization and reports occasional cravings. - Prescribe nicotine  patches for continued support in smoking cessation. - Provide refills for nicotine  patches.  Return in about 3 months (around 12/11/2024). At the next appt: plan to do walking test to see if she requires O2 w exertion.    Marny Patch, MD Pulmonary and Critical Care Medicine [facility] "

## 2024-09-10 ENCOUNTER — Ambulatory Visit (INDEPENDENT_AMBULATORY_CARE_PROVIDER_SITE_OTHER)

## 2024-09-10 VITALS — BP 110/68 | HR 74 | Ht 66.0 in | Wt 165.2 lb

## 2024-09-10 DIAGNOSIS — Z87891 Personal history of nicotine dependence: Secondary | ICD-10-CM

## 2024-09-10 DIAGNOSIS — J441 Chronic obstructive pulmonary disease with (acute) exacerbation: Secondary | ICD-10-CM

## 2024-09-10 MED ORDER — ALBUTEROL SULFATE HFA 108 (90 BASE) MCG/ACT IN AERS: 2.0000 | INHALATION_SPRAY | Freq: Four times a day (QID) | RESPIRATORY_TRACT | 6 refills | Status: AC | PRN

## 2024-09-10 MED ORDER — NICOTINE 7 MG/24HR TD PT24: 14.0000 mg | MEDICATED_PATCH | Freq: Every day | TRANSDERMAL | Status: AC

## 2024-09-10 MED ORDER — BREZTRI AEROSPHERE 160-9-4.8 MCG/ACT IN AERO: 2.0000 | INHALATION_SPRAY | Freq: Two times a day (BID) | RESPIRATORY_TRACT | 6 refills | Status: DC

## 2024-09-10 NOTE — Patient Instructions (Addendum)
 Dear Kelsey West; Given your symptoms, it is most likely you have COPD. I will recommend the following:  -CT chest in 3 months to follow up the changes -Pulmonary function test at the next appointment  -Start Breztri  2 puffs twice a day  -Use albuterol  as need for shortness of breath every 4-6 hours.   INHALER INSTRUCTIONS: To use the inhaler you follow these steps: Prime the inhaler according to instructions (which means waste between 1-4 doses before next use).  Follow package insert Shake the inhaler before each use Exhale completely by blowing all the air out of your lungs Seal your mouth around the inhaler Press down on the canister then inhale SLOW and STEADY until your fill your lungs completely. Hold the breath for 6-10 seconds Gently exhale Wait 60 seconds then repeat steps 2-8. Rinse the mouth after each use to prevent thrush  Your pharmacist can also review proper technique if you have any remaining questions. Let me know if the cost is too high, your insurance may be able to recommend a more affordable option for you.

## 2024-09-10 NOTE — Assessment & Plan Note (Deleted)
  Orders:   Pulmonary Function Test; Future   CT CHEST HIGH RESOLUTION; Future

## 2024-09-20 ENCOUNTER — Telehealth: Payer: Self-pay

## 2024-09-20 ENCOUNTER — Other Ambulatory Visit (HOSPITAL_COMMUNITY): Payer: Self-pay

## 2024-09-20 NOTE — Telephone Encounter (Signed)
*  Pulm  Pharmacy Patient Advocate Encounter   Received notification from Fax that prior authorization for Breztri  Aerosphere 160-9-4.8MCG/ACT aerosol   is required/requested.   Insurance verification completed.   The patient is insured through [facility] MEDICAID.   Insurance requires trial of at least one preferred alternative:   Brand Advair Diskus Brand Advair HFA Brand Symbicort Brand Dulera   Key :[id number]

## 2024-09-20 NOTE — Telephone Encounter (Signed)
 Dr Adrien,

## 2024-09-26 ENCOUNTER — Telehealth: Payer: Self-pay

## 2024-09-26 NOTE — Telephone Encounter (Signed)
 Copied from CRM [id number]. Topic: Clinical - Medication Prior Auth >> Sep 23, 2024  2:39 PM Lavanda D wrote: Reason for CRM: Patient is calling because Dr. Adrien prescribed the Breztri  inhaler, [facility] sent a text and said that it needs a PA. As it stands she has not yet received the prescription. She is calling to see what the status of the PA is.

## 2024-09-26 NOTE — Telephone Encounter (Signed)
 Spoke with patient relayed message [facility].

## 2024-09-27 NOTE — Telephone Encounter (Signed)
 Copied from CRM [id number]. Topic: Clinical - Medication Prior Auth >> Sep 23, 2024  2:39 PM Lavanda D wrote: Reason for CRM: Patient is calling because Dr. Adrien prescribed the Breztri  inhaler, [facility] sent a text and said that it needs a PA. As it stands she has not yet received the prescription. She is calling to see what the status of the PA is. >> Sep 26, 2024  3:27 PM Corean SAUNDERS wrote: Patient is requesting an update on her Breztri  prior authorization as she was just informed by her pharmacy that they have still not received it and also states [facility] faxed a question from for Dr. Adrien to fill out. Patient is requesting a call back   Dr. Adrien please advise which alternative.

## 2024-09-28 MED ORDER — SPIRIVA RESPIMAT 2.5 MCG/ACT IN AERS: 2.0000 | INHALATION_SPRAY | Freq: Every day | RESPIRATORY_TRACT | 6 refills | Status: AC

## 2024-09-28 MED ORDER — BUDESONIDE-FORMOTEROL FUMARATE 160-4.5 MCG/ACT IN AERO: 2.0000 | INHALATION_SPRAY | Freq: Two times a day (BID) | RESPIRATORY_TRACT | 6 refills | Status: AC

## 2024-09-28 NOTE — Addendum Note (Signed)
 Addended by: SANCHEZ FERNANDEZ, Rogene Meth L on: 09/28/2024 01:19 PM   Modules accepted: Orders

## 2024-09-30 NOTE — Telephone Encounter (Signed)
 Hi  I sent a patient 2 inhaler daily: Symbicort  2 puffs twice a day and spiriva  2 puffs daily in the morning. For Symbicort  she needs to rinse her mouth after each use. Could you please call the patient about the change. Breztri  is not covered by her insurance.   Thanks!   Lady  Dr Adrien informed pt of the above via mychart msg which has been read by the pt.

## 2024-12-02 ENCOUNTER — Ambulatory Visit (HOSPITAL_BASED_OUTPATIENT_CLINIC_OR_DEPARTMENT_OTHER)

## 2024-12-04 ENCOUNTER — Encounter

## 2024-12-04 ENCOUNTER — Ambulatory Visit
